# Patient Record
Sex: Male | Born: 1965 | Race: Asian | Hispanic: No | Marital: Married | Smoking: Former smoker
Health system: Southern US, Community
[De-identification: ages and names within clinical notes are randomized; demographics above are authoritative.]

## PROBLEM LIST (undated history)

## (undated) DIAGNOSIS — E785 Hyperlipidemia, unspecified: Secondary | ICD-10-CM

## (undated) DIAGNOSIS — A4901 Methicillin susceptible Staphylococcus aureus infection, unspecified site: Secondary | ICD-10-CM

## (undated) DIAGNOSIS — M4620 Osteomyelitis of vertebra, site unspecified: Secondary | ICD-10-CM

## (undated) DIAGNOSIS — M4640 Discitis, unspecified, site unspecified: Secondary | ICD-10-CM

## (undated) HISTORY — PX: NO PAST SURGERIES: SHX2092

## (undated) HISTORY — DX: Hyperlipidemia, unspecified: E78.5

## (undated) HISTORY — DX: Osteomyelitis of vertebra, site unspecified: M46.20

## (undated) HISTORY — DX: Methicillin susceptible Staphylococcus aureus infection, unspecified site: A49.01

## (undated) HISTORY — DX: Discitis, unspecified, site unspecified: M46.40

---

## 2009-06-15 ENCOUNTER — Ambulatory Visit: Payer: Self-pay | Admitting: Internal Medicine

## 2009-06-15 DIAGNOSIS — E785 Hyperlipidemia, unspecified: Secondary | ICD-10-CM | POA: Insufficient documentation

## 2009-06-17 ENCOUNTER — Encounter: Payer: Self-pay | Admitting: Internal Medicine

## 2009-06-17 LAB — CONVERTED CEMR LAB
Albumin: 4.3 g/dL (ref 3.5–5.2)
Alkaline Phosphatase: 57 units/L (ref 39–117)
Cholesterol: 229 mg/dL — ABNORMAL HIGH (ref 0–200)
Direct LDL: 152.1 mg/dL
HDL: 59.8 mg/dL (ref >39.00)
Total CHOL/HDL Ratio: 4
VLDL: 34 mg/dL (ref 0.0–40.0)

## 2010-05-15 ENCOUNTER — Ambulatory Visit: Payer: Self-pay | Admitting: Internal Medicine

## 2010-05-15 ENCOUNTER — Encounter: Payer: Self-pay | Admitting: Internal Medicine

## 2010-05-15 DIAGNOSIS — R1013 Epigastric pain: Secondary | ICD-10-CM

## 2010-05-15 LAB — CONVERTED CEMR LAB
BUN: 13 mg/dL (ref 6–23)
CO2: 28 meq/L (ref 19–32)
Chloride: 104 meq/L (ref 96–112)
Eosinophils Absolute: 0.2 10*3/uL (ref 0.0–0.7)
Glucose, Bld: 86 mg/dL (ref 70–99)
Lymphocytes Relative: 30 % (ref 12–46)
Lymphs Abs: 1.7 10*3/uL (ref 0.7–4.0)
MCV: 87.4 fL (ref 78.0–100.0)
Monocytes Relative: 8 % (ref 3–12)
Neutro Abs: 3.2 10*3/uL (ref 1.7–7.7)
Neutrophils Relative %: 57 % (ref 43–77)
Platelets: 312 10*3/uL (ref 150–400)
Potassium: 3.9 meq/L (ref 3.5–5.3)
RBC: 5.4 M/uL (ref 4.22–5.81)
Sodium: 139 meq/L (ref 135–145)
WBC: 5.6 10*3/uL (ref 4.0–10.5)

## 2010-05-19 ENCOUNTER — Telehealth: Payer: Self-pay | Admitting: Internal Medicine

## 2010-05-19 ENCOUNTER — Encounter: Payer: Self-pay | Admitting: Internal Medicine

## 2010-05-19 ENCOUNTER — Encounter (INDEPENDENT_AMBULATORY_CARE_PROVIDER_SITE_OTHER): Payer: Self-pay | Admitting: *Deleted

## 2010-05-19 DIAGNOSIS — A048 Other specified bacterial intestinal infections: Secondary | ICD-10-CM | POA: Insufficient documentation

## 2010-05-25 ENCOUNTER — Ambulatory Visit: Payer: Self-pay | Admitting: Internal Medicine

## 2010-05-30 ENCOUNTER — Encounter: Payer: Self-pay | Admitting: Gastroenterology

## 2010-06-27 NOTE — Assessment & Plan Note (Signed)
Summary: NEW / [employer] / #/ CD   Vital Signs:  Patient profile:   45 year old male Height:      64 inches Weight:      171 pounds BMI:     29.46 O2 Sat:      96 % on Room air Temp:     97.5 degrees F oral Pulse rate:   86 / minute Pulse rhythm:   regular Resp:     16 per minute BP sitting:   108 / 78  (left arm) Cuff size:   large  Vitals Entered By: Rock Nephew CMA (June 15, 2009 2:44 PM)  Nutrition Counseling: Patient's BMI is greater than 25 and therefore counseled on weight management options.  O2 Flow:  Room air CC: new to establish Is Patient Diabetic? No Pain Assessment Patient in pain? no        Primary Care Provider:  Etta Grandchild MD  CC:  new to establish.  History of Present Illness: New to me for a complete physical, no complaints.  Preventive Screening-Counseling & Management  Alcohol-Tobacco     Alcohol drinks/day: <1     Alcohol type: spirits     >5/day in last 3 mos: no     Alcohol Counseling: not indicated; use of alcohol is not excessive or problematic     Feels need to cut down: no     Feels annoyed by complaints: no     Feels guilty re: drinking: no     Needs 'eye opener' in am: no     Smoking Status: never  Caffeine-Diet-Exercise     Does Patient Exercise: yes  Hep-HIV-STD-Contraception     Hepatitis Risk: no risk noted     HIV Risk: no risk noted     STD Risk: no risk noted     TSE monthly: yes     Testicular SE Education/Counseling to perform regular STE      Sexual History:  currently monogamous.        Drug Use:  never and no.        Blood Transfusions:  no.    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 Married Never Smoked Alcohol use-yes Drug use-no Regular exercise-yes Smoking Status:  never Drug Use:  never, no Does Patient Exercise:  yes Hepatitis Risk:  no risk noted HIV Risk:  no risk noted STD Risk:  no risk noted Sexual History:  currently monogamous Blood Transfusions:  no  Review of Systems  The patient denies anorexia, fever, weight loss, weight gain, chest pain, dyspnea on exertion, prolonged cough, headaches, hemoptysis, abdominal pain, melena, hematochezia, severe indigestion/heartburn, hematuria, suspicious skin lesions, enlarged lymph nodes, angioedema, and testicular masses.    Physical Exam  General:  alert, well-developed, well-nourished, well-hydrated, appropriate dress, normal appearance, healthy-appearing, cooperative to examination, and good hygiene.   Head:  normocephalic, atraumatic, no abnormalities observed, and no abnormalities palpated.   Eyes:  vision grossly intact, pupils equal, pupils round, and pupils reactive to light.   Mouth:  Oral mucosa and oropharynx without lesions or exudates.  Teeth in good repair. Neck:  supple, full ROM, no masses, no thyromegaly, no  thyroid nodules or tenderness, no JVD, normal carotid upstroke, no carotid bruits, no cervical lymphadenopathy, and no neck tenderness.   Chest Wall:  No deformities, masses, tenderness or gynecomastia noted. Breasts:  No masses or gynecomastia noted Lungs:  Normal respiratory effort, chest expands symmetrically. Lungs are clear to auscultation, no crackles or wheezes. Heart:  Normal rate and regular rhythm. S1 and S2 normal without gallop, murmur, click, rub or other extra sounds. Abdomen:  soft, non-tender, normal bowel sounds, no distention, no masses, no guarding, no rigidity, no rebound tenderness, no abdominal hernia, no hepatomegaly, and no splenomegaly.   Rectal:  No external abnormalities noted. Normal  sphincter tone. No rectal masses or tenderness. heme neg.stool. Genitalia:  circumcised, no hydrocele, no varicocele, no scrotal masses, no testicular masses or atrophy, no cutaneous lesions, and no urethral discharge.   Prostate:  Prostate gland firm and smooth, no enlargement, nodularity, tenderness, mass, asymmetry or induration. Msk:  No deformity or scoliosis noted of thoracic or lumbar spine.   Pulses:  R and L carotid,radial,femoral,dorsalis pedis and posterior tibial pulses are full and equal bilaterally Extremities:  No clubbing, cyanosis, edema, or deformity noted with normal full range of motion of all joints.   Neurologic:  No cranial nerve deficits noted. Station and gait are normal. Plantar reflexes are down-going bilaterally. DTRs are symmetrical throughout. Sensory, motor and coordinative functions appear intact. Skin:  Intact without suspicious lesions or rashes Cervical Nodes:  No lymphadenopathy noted Axillary Nodes:  No palpable lymphadenopathy Inguinal Nodes:  No significant adenopathy Psych:  Cognition and judgment appear intact. Alert and cooperative with normal attention span and concentration. No apparent delusions, illusions, hallucinations Additional Exam:  EKG is normal.   Impression & Recommendations:  Problem # 1:  ROUTINE GENERAL MEDICAL EXAM@HEALTH  CARE FACL (ICD-V70.0) Assessment New  Orders: Hemoccult Guaiac-1 spec.(in office) (82270) EKG w/ Interpretation (93000)  Discussed using sunscreen, use of alcohol, drug use, self testicular exam, routine dental care, routine eye care, routine physical exam, seat belts, multiple vitamins,  and recommendations for immunizations.  Discussed exercise and checking cholesterol.    Problem # 2:  HYPERLIPIDEMIA (ICD-272.4) Assessment: New  Orders: Venipuncture (50539) TLB-Lipid Panel (80061-LIPID) TLB-Hepatic/Liver Function Pnl (80076-HEPATIC) TLB-TSH (Thyroid Stimulating Hormone) (76734-LPF)  Patient  Instructions: 1)  Please schedule a follow-up appointment as needed. 2)  It is important that you exercise regularly at least 20 minutes 5 times a week. If you develop chest pain, have severe difficulty breathing, or feel very tired , stop exercising immediately and seek medical attention. 3)  You need to lose weight. Consider a lower calorie diet and regular exercise.

## 2010-06-27 NOTE — Letter (Signed)
Summary: Results Follow-up Letter  [facility]  [address] [place], [place] [address]   Phone: [phone]  Fax: [phone]    06/17/2009  [address] [place], [place]  [address]  Dear Mr. JEFFERYS,   The following are the results of your recent test(s):  Test     Result     Liver       normal Thyroid     normal  _________________________________________________________  Please call for an appointment in 1-2 months _________________________________________________________ _________________________________________________________ _________________________________________________________  Sincerely,  Sanda Linger MD [facility]

## 2010-06-27 NOTE — Letter (Signed)
Summary: Lipid Letter  [facility]  [address] [place], [place] [address]   Phone: [phone]  Fax: [phone]    06/17/2009  Mitchell Sparks [address] [place], [place]  [address]  Dear Mitchell Sparks:  We have carefully reviewed your last lipid profile from  and the results are noted below with a summary of recommendations for lipid management.    Cholesterol:       229     Goal: <200   HDL "good" Cholesterol:   62.37     Goal: >40   LDL "bad" Cholesterol:   152     Goal: <130   Triglycerides:       170.0     Goal: <150    there's good news and bad news with these results    TLC Diet (Therapeutic Lifestyle Change): Saturated Fats & Transfatty acids should be kept < 7% of total calories ***Reduce Saturated Fats Polyunstaurated Fat can be up to 10% of total calories Monounsaturated Fat Fat can be up to 20% of total calories Total Fat should be no greater than 25-35% of total calories Carbohydrates should be 50-60% of total calories Protein should be approximately 15% of total calories Fiber should be at least 20-30 grams a day ***Increased fiber may help lower LDL Total Cholesterol should be < 200mg /day Consider adding plant stanol/sterols to diet (example: Benacol spread) ***A higher intake of unsaturated fat may reduce Triglycerides and Increase HDL    Adjunctive Measures (may lower LIPIDS and reduce risk of Heart Attack) include: Aerobic Exercise (20-30 minutes 3-4 times a week) Limit Alcohol Consumption Weight Reduction Aspirin 75-81 mg a day by mouth (if not allergic or contraindicated) Dietary Fiber 20-30 grams a day by mouth     Current Medications:  None If you have any questions, please call. We appreciate being able to work with you.   Sincerely,    [facility] Mitchell Grandchild MD

## 2010-06-28 ENCOUNTER — Encounter: Payer: Self-pay | Admitting: Internal Medicine

## 2010-06-29 NOTE — Progress Notes (Signed)
Summary: Lab results & referral to GI  Phone Note Outgoing Call   Summary of Call: call pt - blood test shows positive antibody for H. Pylori.  I suggest GI referral for further testing Initial call taken by: D. Thomos Lemons DO,  May 19, 2010 3:22 PM  Follow-up for Phone Call        call placed to patient at [phone], he has been advised per Dr Artist Pais instructions.  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

## 2010-06-29 NOTE — Assessment & Plan Note (Signed)
Summary: TO EST/HEA  ABD PAIN   Vital Signs:  Patient profile:   45 year old male Height:      65 inches Weight:      169.50 pounds BMI:     28.31 O2 Sat:      97 % on Room air Temp:     98.1 degrees F oral Pulse rate:   87 / minute Resp:     20 per minute BP sitting:   100 / 70  (left arm) Cuff size:   large  Vitals Entered By: Glendell Docker CMA (May 15, 2010 1:19 PM)  O2 Flow:  Room air CC: New Patient  Is Patient Diabetic? No Pain Assessment Patient in pain? no      Comments stomach problems, c/o bloating , no bowel problems for the past 2 months   Primary Care Provider:  Dondra Spry DO  CC:  New Patient .  History of Present Illness: 45 y/o Asian male to establish wakes up at night with burning sensation abd bloating - uncomfortable sensation onset - on and off 2 months,  worse over last 2 weeks food can improve symptoms localized to epigastric area no NSAIDs but has been taking some ibuprofen for several days for right forearm pain  previously tried reglan - "chinese version"  denies frequent heartburn no dysphagia  normal BMs no melena or hematochezia    Preventive Screening-Counseling & Management  Alcohol-Tobacco     Alcohol drinks/day: <1     Smoking Status: quit     Packs/Day: 0.25     Year Started: 1988     Year Quit: 1993  Caffeine-Diet-Exercise     Caffeine use/day: 3 beverages daily     Does Patient Exercise: yes     Times/week: <3  Allergies (verified): No Known Drug Allergies  Past History:  Past Medical History: Hyperlipidemia    Past Surgical History: Denies surgical history    Family History: Family History Diabetes 1st degree relative Family History High cholesterol Family History Hypertension Family History of Stroke F 1st degree relative <60 mother has "stomach upset" no GI cancers  Social History: Occupation: PhD Chemist in [employer]   moved to [place] 1996 - prev lived near [place] Married- 18  years Never Smoked Alcohol use-yes  Drug use-no Regular exercise-yes 1 son 3 1 daughter age 4Smoking Status:  quit Packs/Day:  0.25 Caffeine use/day:  3 beverages daily  Review of Systems  The patient denies anorexia, fever, weight loss, weight gain, chest pain, dyspnea on exertion, prolonged cough, melena, hematochezia, and severe indigestion/heartburn.    Physical Exam  General:  alert, well-developed, and well-nourished.   Mouth:  good dentition, no gingival abnormalities, and pharynx pink and moist.   Neck:  No deformities, masses, or tenderness noted. Lungs:  normal respiratory effort and normal breath sounds.   Heart:  normal rate, regular rhythm, no murmur, and no gallop.   Abdomen:  soft, non-tender, normal bowel sounds, no masses, no hepatomegaly, and no splenomegaly.   Rectal:  no external abnormalities, no hemorrhoids, normal sphincter tone, and no masses.  hemoccult negativea Prostate:  no nodules and no asymmetry.     Impression & Recommendations:  Problem # 1:  ABDOMINAL PAIN, EPIGASTRIC (ICD-789.06) 45 y/o with epigastric discomfort.   possible PUD / gastritis.   consider H. Pylori If positive, refer to GI for EGD to rule out malignancy  use zantac as directed dietary measures reviewed  Orders: T-Basic Metabolic Panel [phone]) T-CBC w/Diff [phone])  T- * Misc. Laboratory test [phone])  Complete Medication List: 1)  Ranitidine Hcl 150 Mg Tabs (Ranitidine hcl) .... One by mouth two times a day  Patient Instructions: 1)  Please schedule a follow-up appointment in 3 months. 2)  Lipid Panel prior to visit, ICD-9: 272.3 3)  High sensitivity CRP :  272.4 4)  Please return for lab work one (1) week before your next appointment.  Prescriptions: RANITIDINE HCL 150 MG TABS (RANITIDINE HCL) one by mouth two times a day  #60 x 3   Entered and Authorized by:   D. Thomos Lemons DO   Signed by:   D. Thomos Lemons DO on 05/15/2010   Method used:   Electronically to         [id number] (retail)       [address]       [place], [place]  [address]       Ph: [phone] or [phone]       Fax: [phone]   RxID:   [id number]    Orders Added: 1)  T-Basic Metabolic Panel [phone] 2)  T-CBC w/Diff [[id number]] 3)  T- * Misc. Laboratory test [99999] 4)  New Patient Level III [99203]   Immunization History:  Influenza Immunization History:    Influenza:  historical (05/09/2010)   Immunization History:  Influenza Immunization History:    Influenza:  Historical (05/09/2010)  Current Allergies (reviewed today): No known allergies

## 2010-06-29 NOTE — Letter (Signed)
Summary: Primary Care Consult Scheduled Letter  [facility] [place] [facility]  [address]   [place], [place] [address]   Phone: [phone]  Fax: [phone]      05/19/2010 MRN: [id number]  [facility] [place] [address] [place], [place]  [address]    Dear Mr. CLAPPER,      We have scheduled an appointment for you.  At the recommendation of Dr.YOO, we have scheduled you a consult with [facility] , DR Marina Goodell on JANUARY 29,2012  at 9:15AM .  Their address is_[address], [place]  . The office phone number is [phone].  If this appointment day and time is not convenient for you, please feel free to call the office of the doctor you are being referred to at the number listed above and reschedule the appointment.     It is important for you to keep your scheduled appointments. We are here to make sure you are given good patient care.     Thank you, Darral Dash Patient Care Coordinator [place] [facility]

## 2010-06-29 NOTE — Letter (Signed)
Summary: New Patient letter  [facility]  [address] [place], [place] [address]   Phone: [phone]  Fax: [phone]       05/30/2010 MRN: [id number]  [facility] [address] [place], [place]  [address]  Dear Mitchell Sparks,  Welcome to the Gastroenterology Division at [facility].    You are scheduled to see Dr.  Arlyce Dice on 07-05-10 at 3:30pm  on the 3rd floor at [facility], [address]  We ask that you try to arrive at our office 15 minutes prior to your appointment time to allow for check-in.  We would like you to complete the enclosed self-administered evaluation form prior to your visit and bring it with you on the day of your appointment.  We will review it with you.  Also, please bring a complete list of all your medications or, if you prefer, bring the medication bottles and we will list them.  Please bring your insurance card so that we may make a copy of it.  If your insurance requires a referral to see a specialist, please bring your referral form from your primary care physician.  Co-payments are due at the time of your visit and may be paid by cash, check or credit card.     Your office visit will consist of a consult with your physician (includes a physical exam), any laboratory testing he/she may order, scheduling of any necessary diagnostic testing (e.g. x-ray, ultrasound, CT-scan), and scheduling of a procedure (e.g. Endoscopy, Colonoscopy) if required.  Please allow enough time on your schedule to allow for any/all of these possibilities.    If you cannot keep your appointment, please call [phone] to cancel or reschedule prior to your appointment date.  This allows [place] the opportunity to schedule an appointment for another patient in need of care.  If you do not cancel or reschedule by 5 p.m. the business day prior to your appointment date, you will be charged a $50.00 late cancellation/no-show fee.    Thank you for choosing  [facility] for your medical needs.  We appreciate the opportunity to care for you.  Please visit [place] at our website  to learn more about our practice.                     Sincerely,                                                             The Gastroenterology Division

## 2010-06-29 NOTE — Letter (Signed)
Summary: New Patient letter  [facility]  [address] [place], [place] [address]   Phone: [phone]  Fax: [phone]       05/19/2010 MRN: [id number]  [facility] Mitchell Sparks [address] [place], [place]  [address]  Dear Mitchell Sparks,  Welcome to the Gastroenterology Division at [facility].    You are scheduled to see Dr.  Marina Goodell on 05-25-10 at 9:15am on the 3rd floor at [facility], [address].  We ask that you try to arrive at our office 15 minutes prior to your appointment time to allow for check-in.  We would like you to complete the enclosed self-administered evaluation form prior to your visit and bring it with you on the day of your appointment.  We will review it with you.  Also, please bring a complete list of all your medications or, if you prefer, bring the medication bottles and we will list them.  Please bring your insurance card so that we may make a copy of it.  If your insurance requires a referral to see a specialist, please bring your referral form from your primary care physician.  Co-payments are due at the time of your visit and may be paid by cash, check or credit card.     Your office visit will consist of a consult with your physician (includes a physical exam), any laboratory testing he/she may order, scheduling of any necessary diagnostic testing (e.g. x-ray, ultrasound, CT-scan), and scheduling of a procedure (e.g. Endoscopy, Colonoscopy) if required.  Please allow enough time on your schedule to allow for any/all of these possibilities.    If you cannot keep your appointment, please call [phone] to cancel or reschedule prior to your appointment date.  This allows [place] the opportunity to schedule an appointment for another patient in need of care.  If you do not cancel or reschedule by 5 p.m. the business day prior to your appointment date, you will be charged a $50.00 late cancellation/no-show fee.    Thank you for choosing  [facility] for your medical needs.  We appreciate the opportunity to care for you.  Please visit [place] at our website  to learn more about our practice.                     Sincerely,                                                             The Gastroenterology Division

## 2010-07-05 ENCOUNTER — Ambulatory Visit (INDEPENDENT_AMBULATORY_CARE_PROVIDER_SITE_OTHER): Payer: BC Managed Care – PPO | Admitting: Gastroenterology

## 2010-07-05 ENCOUNTER — Encounter: Payer: Self-pay | Admitting: Gastroenterology

## 2010-07-05 ENCOUNTER — Other Ambulatory Visit: Payer: BC Managed Care – PPO

## 2010-07-05 DIAGNOSIS — R1013 Epigastric pain: Secondary | ICD-10-CM

## 2010-07-05 DIAGNOSIS — A048 Other specified bacterial intestinal infections: Secondary | ICD-10-CM

## 2010-07-07 ENCOUNTER — Encounter: Payer: Self-pay | Admitting: Gastroenterology

## 2010-07-11 ENCOUNTER — Encounter: Payer: Self-pay | Admitting: Gastroenterology

## 2010-07-13 NOTE — Letter (Signed)
Summary: Results Letter  [facility]  [address] [place], [place] [address]   Phone: [phone]  Fax: [phone]        July 05, 2010 MRN: [id number]    [facility] [address] [place], [place]  [address]    Dear Mr. AUBUCHON,  It is my pleasure to have treated you recently as a new patient in my office. I appreciate your confidence and the opportunity to participate in your care.  Since I do have a busy inpatient endoscopy schedule and office schedule, my office hours vary weekly. I am, however, available for emergency calls everyday through my office. If I am not available for an urgent office appointment, another one of our gastroenterologist will be able to assist you.  My well-trained staff are prepared to help you at all times. For emergencies after office hours, a physician from our Gastroenterology section is always available through my 24 hour answering service  Once again I welcome you as a new patient and I look forward to a happy and healthy relationship             Sincerely,  Louis Meckel MD  This letter has been electronically signed by your physician.  Appended Document: Results Letter LETTER MAILED

## 2010-07-13 NOTE — Assessment & Plan Note (Signed)
Summary: H PILORY/YF NO GI HX PER HELEN [phone]/INS BLU CROSS/MAILER ...   History of Present Illness Visit Type: Initial Consult Primary GI MD: Mitchell Heaps MD [facility] Primary Provider: Dondra Spry DO Requesting Provider: Dondra Spry DO Chief Complaint: Patient dx with h pylori and was given Ranitidine no antibiotics. He states that he never started the Ranitidine and he is feeling better than he did at his last office visit with Mitchell Sparks. History of Present Illness:   Mitchell Sparks is a 45 year old Asian male referred at the request of Mitchell Sparks for evaluation of abdominal pain.  Approximately a month ago he was seen by Mitchell Sparks for upper abdominal discomfort.  This was described as an aching pain.  Blood work was positive for H. pylori antibody.  Although prescribed a PPI he did not take any medicines.  Symptoms subsequently subsided.  He now has only rare abdominal bloating.   GI Review of Systems      Denies abdominal pain, acid reflux, belching, bloating, chest pain, dysphagia with liquids, dysphagia with solids, heartburn, loss of appetite, nausea, vomiting, vomiting blood, weight loss, and  weight gain.        Denies anal fissure, black tarry stools, change in bowel habit, constipation, diarrhea, diverticulosis, fecal incontinence, heme positive stool, hemorrhoids, irritable bowel syndrome, jaundice, light color stool, liver problems, rectal bleeding, and  rectal pain.    Current Medications (verified): 1)  None  Allergies (verified): No Known Drug Allergies  Past History:  Past Medical History: Hyperlipidemia h pylori  Past Surgical History: Reviewed history from 05/15/2010 and no changes required. Denies surgical history    Family History: Family History Diabetes 1st degree relative: father mother has "stomach upset" no GI cancers  Social History: Occupation: PhD Chemist in [employer]   moved to [place] 1996 - prev lived near [place] Married- 18 years Never Smoked Alcohol  use-yes  socially Drug use-no Regular exercise-yes 1 son 7 1 daughter age 72 Daily Caffeine Use 2 cups per day  Review of Systems  The patient denies allergy/sinus, anemia, anxiety-new, arthritis/joint pain, back pain, blood in urine, breast changes/lumps, change in vision, confusion, cough, coughing up blood, depression-new, fainting, fatigue, fever, headaches-new, hearing problems, heart murmur, heart rhythm changes, itching, muscle pains/cramps, night sweats, nosebleeds, shortness of breath, skin rash, sleeping problems, sore throat, swelling of feet/legs, swollen lymph glands, thirst - excessive, urination - excessive, urination changes/pain, urine leakage, vision changes, and voice change.    Vital Signs:  Patient profile:   45 year old male Height:      65 inches Weight:      169.8 pounds BMI:     28.36 Pulse rate:   88 / minute Pulse rhythm:   regular BP sitting:   110 / 70  (left arm) Cuff size:   regular  Vitals Entered By: Mitchell Sparks CMA Duncan Dull) (July 05, 2010 3:46 PM)  Physical Exam  Additional Exam:  On physical exam he is a healthy-appearing male  skin: anicteric HEENT: normocephalic; PEERLA; no nasal or pharyngeal abnormalities neck: supple nodes: no cervical lymphadenopathy chest: clear to ausculatation and percussion heart: no murmurs, gallops, or rubs abd: soft, nontender; BS normoactive; no abdominal masses, tenderness, organomegaly rectal: deferred ext: no cynanosis, clubbing, edema skeletal: no deformities neuro: oriented x 3; no focal abnormalities    Impression & Recommendations:  Problem # 1:  HELICOBACTER PYLORI INFECTION (ICD-041.86) serum antibody positivity reflex exposure to H. pylori.  It is not specific for a  current infection.  Recommendations #1 check stools for  H.pylori antigen; if positive I will treat him.  If negative, in the absence of symptoms, I would not pursue this any further. Orders: T-Helicobactor Pylori Antigen Stool  ([address])  Patient Instructions: 1)  Copy sent to : D. Thomos Lemons DO 2)  You will go to the basement today for labs 3)  The medication list was reviewed and reconciled.  All changed / newly prescribed medications were explained.  A complete medication list was provided to the patient / caregiver.

## 2010-07-19 NOTE — Letter (Signed)
Summary: Results Letter  [facility]  [address] [place], [place] [address]   Phone: [phone]  Fax: [phone]        July 11, 2010 MRN: [id number]    [facility] [address] [place], [place]  [address]    Dear Mr. NOVELLO,  Your biopsies revealed  the presence of a bacteria called H. Pylori.  This is associated with recurrent inflammation of the stomach and duodenum, and recurrent ulcer disease.  My nurse will be calling in a prescription for treatment.            Sincerely,  Louis Meckel MD  This letter has been electronically signed by your physician.  Appended Document: Results Letter Letter is mailed to the patient's home address

## 2010-08-18 ENCOUNTER — Encounter: Payer: Self-pay | Admitting: Internal Medicine

## 2010-08-18 ENCOUNTER — Ambulatory Visit: Payer: Self-pay | Admitting: Internal Medicine

## 2010-08-18 ENCOUNTER — Ambulatory Visit (INDEPENDENT_AMBULATORY_CARE_PROVIDER_SITE_OTHER): Payer: BC Managed Care – PPO | Admitting: Internal Medicine

## 2010-08-18 DIAGNOSIS — A048 Other specified bacterial intestinal infections: Secondary | ICD-10-CM

## 2010-08-18 DIAGNOSIS — N529 Male erectile dysfunction, unspecified: Secondary | ICD-10-CM

## 2010-08-18 DIAGNOSIS — E785 Hyperlipidemia, unspecified: Secondary | ICD-10-CM

## 2010-08-18 NOTE — Progress Notes (Signed)
  Subjective:    Patient ID: Mitchell Sparks, male    DOB: 05/03/1966, 45 y.o.   MRN: [id number]  HPI 45 y/o male for followup Pt prev seen for dyspepsis.  Pt found to have H Pylori infection.  (stool antigen was positive) Pt completely full course of abx and PPI.  He is feeling much better. He denies dysphagia.   Review of Systems He c/o poor erection quality.  Mild decrease in libido    Objective:   Physical Exam  Constitutional: He appears well-developed and well-nourished.  Cardiovascular: Normal rate, regular rhythm and normal heart sounds.   Pulmonary/Chest: Effort normal and breath sounds normal.  Abdominal: Soft. Bowel sounds are normal. He exhibits no mass. There is no tenderness.          Assessment & Plan:

## 2010-08-24 NOTE — Assessment & Plan Note (Signed)
Rule out hypogonadism

## 2010-08-24 NOTE — Assessment & Plan Note (Signed)
Reassess risk.  Lab Results  Component Value Date   CHOL 229* 06/17/2009   Lab Results  Component Value Date   HDL 59.80 06/17/2009   No results found for this basename: [facility]   Lab Results  Component Value Date   TRIG 170.0* 06/17/2009   Lab Results  Component Value Date   CHOLHDL 4 06/17/2009

## 2010-08-24 NOTE — Assessment & Plan Note (Signed)
Improved with prev pac. Repeat H. Pylori stool antigen to ensure eradication

## 2010-09-14 LAB — HIGH SENSITIVITY CRP: CRP, High Sensitivity: 0.3 mg/L

## 2010-09-14 LAB — LIPID PANEL
LDL Cholesterol: 143 mg/dL — ABNORMAL HIGH (ref 0–99)
Triglycerides: 210 mg/dL — ABNORMAL HIGH (ref <150)
VLDL: 42 mg/dL — ABNORMAL HIGH (ref 0–40)

## 2010-09-16 LAB — HELICOBACTER PYLORI  SPECIAL ANTIGEN

## 2010-09-21 ENCOUNTER — Ambulatory Visit (INDEPENDENT_AMBULATORY_CARE_PROVIDER_SITE_OTHER): Payer: BC Managed Care – PPO | Admitting: Internal Medicine

## 2010-09-21 ENCOUNTER — Encounter: Payer: Self-pay | Admitting: Internal Medicine

## 2010-09-21 VITALS — BP 100/70 | HR 88 | Temp 97.8°F | Resp 18 | Ht 65.0 in | Wt 171.0 lb

## 2010-09-21 DIAGNOSIS — E785 Hyperlipidemia, unspecified: Secondary | ICD-10-CM

## 2010-09-21 DIAGNOSIS — N529 Male erectile dysfunction, unspecified: Secondary | ICD-10-CM

## 2010-09-21 DIAGNOSIS — D2360 Other benign neoplasm of skin of unspecified upper limb, including shoulder: Secondary | ICD-10-CM

## 2010-09-21 DIAGNOSIS — A048 Other specified bacterial intestinal infections: Secondary | ICD-10-CM

## 2010-09-21 NOTE — Patient Instructions (Signed)
Please complete the following lab tests before your next follow up appointment: FLP, CRP  - 272.4 

## 2010-09-26 LAB — TESTOSTERONE: Testosterone: 296.59 ng/dL (ref 250–890)

## 2010-10-02 NOTE — Progress Notes (Deleted)
Pt can follow up within 1 year

## 2010-10-06 ENCOUNTER — Telehealth: Payer: Self-pay | Admitting: Internal Medicine

## 2010-10-06 NOTE — Telephone Encounter (Signed)
Patient states he spoke with the nurse a week ago about his testosterone level and when to follow up on the results the nurse gave him on the phone.  States he asked her at that time what to do and has been waiting for an answer

## 2010-10-08 NOTE — Telephone Encounter (Signed)
If he would like to try taking viagra for mild ED, he can pick up sample at office

## 2010-10-08 NOTE — Telephone Encounter (Signed)
Please notify pt that his testosterone levels are normal and he can follow up within  1 yr

## 2010-10-09 MED ORDER — SILDENAFIL CITRATE 50 MG PO TABS: 50.0000 mg | ORAL_TABLET | ORAL | Status: DC | PRN

## 2010-10-09 NOTE — Telephone Encounter (Signed)
Left message on machine for pt to return my call  

## 2010-10-09 NOTE — Telephone Encounter (Signed)
Pt notified and is agreeable to pick up sample. 1 pack left at front desk.

## 2010-10-16 DIAGNOSIS — D2360 Other benign neoplasm of skin of unspecified upper limb, including shoulder: Secondary | ICD-10-CM | POA: Insufficient documentation

## 2010-10-16 NOTE — Assessment & Plan Note (Addendum)
  Pt would like to try lifestyle changes.  Low sat fat diet handout provided. I encouraged regular exercise  Lab Results  Component Value Date   CHOL 243* 09/14/2010   CHOL 229* 06/17/2009   Lab Results  Component Value Date   HDL 58 09/14/2010   HDL 59.80 06/17/2009   Lab Results  Component Value Date   LDLCALC 143* 09/14/2010   Lab Results  Component Value Date   TRIG 210* 09/14/2010   TRIG 170.0* 06/17/2009   Lab Results  Component Value Date   CHOLHDL 4.2 09/14/2010   CHOLHDL 4 06/17/2009   Lab Results  Component Value Date   LDLDIRECT 152.1 06/17/2009

## 2010-10-16 NOTE — Assessment & Plan Note (Signed)
Pt with enlarging hyperpigmented lesion of right upper arm Consent obtained Sterile technique utilized to perform shave biopsy with derma blade and forceps. 0.2 cc of lidocaine with epi used for anesthesia. Aftercare discussed. Specimen sent to pathology.

## 2010-10-16 NOTE — Progress Notes (Signed)
  Subjective:    Patient ID: Mitchell Sparks, male    DOB: March 13, 1966, 45 y.o.   MRN: [id number]  Hyperlipidemia This is a recurrent problem. The current episode started more than 1 month ago. The problem is uncontrolled. Recent lipid tests were reviewed and are high. He has no history of diabetes, hypothyroidism or liver disease. Factors aggravating his hyperlipidemia include no known factors. Pertinent negatives include no chest pain. He is currently on no antihyperlipidemic treatment. Risk factors for coronary artery disease include a sedentary lifestyle.   Pt also complains of enlarging right upper arm raised hyperpigmented skin lesion.  Review of Systems  Cardiovascular: Negative for chest pain.    Past Medical History  Diagnosis Date  . Hyperlipemia     History   Social History  . Marital Status: Married    Spouse Name: N/A    Number of Children: N/A  . Years of Education: N/A   Occupational History  . Not on file.   Social History Main Topics  . Smoking status: Former Games developer  . Smokeless tobacco: Not on file  . Alcohol Use: Yes  . Drug Use: No  . Sexually Active: Not on file   Other Topics Concern  . Not on file   Social History Narrative  . No narrative on file    Past Surgical History  Procedure Date  . Denies     Family History  Problem Relation Age of Onset  . Diabetes    . Hyperlipidemia    . Hypertension    . Stroke      No Known Allergies  No current outpatient prescriptions on file prior to visit.    BP 100/70  Pulse 88  Temp(Src) 97.8 F (36.6 C) (Oral)  Resp 18  Ht 5\' 5"  (1.651 m)  Wt 171 lb (77.565 kg)  BMI 28.46 kg/m2  SpO2 98%       Objective:   Physical Exam  Constitutional: He appears well-developed and well-nourished.  Cardiovascular: Normal rate, regular rhythm and normal heart sounds.   Pulmonary/Chest: Effort normal and breath sounds normal.  Abdominal: Soft. Bowel sounds are normal.  Skin:       3- 4 mm raised  hyperpigmented skin lesion right upper arm  Psychiatric: He has a normal mood and affect. His behavior is normal.       Assessment & Plan:

## 2010-10-16 NOTE — Assessment & Plan Note (Signed)
H.Pylori stool antigen: negative

## 2011-11-26 ENCOUNTER — Encounter: Payer: Self-pay | Admitting: Internal Medicine

## 2011-11-26 ENCOUNTER — Other Ambulatory Visit: Payer: Self-pay | Admitting: *Deleted

## 2011-11-26 ENCOUNTER — Ambulatory Visit (INDEPENDENT_AMBULATORY_CARE_PROVIDER_SITE_OTHER): Payer: BC Managed Care – PPO | Admitting: Internal Medicine

## 2011-11-26 VITALS — BP 102/68 | HR 84 | Temp 98.1°F | Resp 16 | Ht 65.0 in | Wt 169.8 lb

## 2011-11-26 DIAGNOSIS — Z0000 Encounter for general adult medical examination without abnormal findings: Secondary | ICD-10-CM

## 2011-11-26 DIAGNOSIS — N529 Male erectile dysfunction, unspecified: Secondary | ICD-10-CM

## 2011-11-26 DIAGNOSIS — G4700 Insomnia, unspecified: Secondary | ICD-10-CM

## 2011-11-26 MED ORDER — SILDENAFIL CITRATE 50 MG PO TABS: 50.0000 mg | ORAL_TABLET | ORAL | Status: DC | PRN

## 2011-11-26 NOTE — Assessment & Plan Note (Signed)
Attempt viagra prn. Dosing instructions provided.

## 2011-11-26 NOTE — Progress Notes (Signed)
  Subjective:    Patient ID: Mitchell Sparks, male    DOB: [birth year], 46 y.o.   MRN: [id number]  HPI Pt presents to clinic for annual physical. Notes h/o ED previously prescribed viagra but did not receive prescription. H/o mild hyperlipidemia not requiring medication to date. Notes increased job stress this year and subsequent insomnia. Initiates sleep but intermittently awakens at 2-3 am and finds it difficult to return to sleep.   Past Medical History  Diagnosis Date  . Hyperlipemia    Past Surgical History  Procedure Date  . Denies     reports that he has quit smoking. He does not have any smokeless tobacco history on file. He reports that he drinks alcohol. He reports that he does not use illicit drugs. family history includes Diabetes in an unspecified family member; Hyperlipidemia in an unspecified family member; Hypertension in an unspecified family member; and Stroke in an unspecified family member. No Known Allergies   Review of Systems see hpi     Objective:   Physical Exam  Nursing note and vitals reviewed. Constitutional: He appears well-developed and well-nourished. No distress.  HENT:  Head: Normocephalic and atraumatic.  Right Ear: Tympanic membrane, external ear and ear canal normal.  Left Ear: Tympanic membrane, external ear and ear canal normal.  Nose: Nose normal.  Mouth/Throat: Oropharynx is clear and moist. No oropharyngeal exudate.  Eyes: Conjunctivae and EOM are normal. Pupils are equal, round, and reactive to light. No scleral icterus.  Neck: Neck supple. Carotid bruit is not present. No thyromegaly present.  Cardiovascular: Normal rate, regular rhythm and normal heart sounds.  Exam reveals no gallop and no friction rub.   No murmur heard. Pulmonary/Chest: Effort normal and breath sounds normal. No respiratory distress. He has no wheezes. He has no rales.  Abdominal: Soft. Normal appearance and bowel sounds are normal. He exhibits no distension and no mass.  There is no hepatosplenomegaly. There is no tenderness. There is no rebound and no guarding.  Lymphadenopathy:    He has no cervical adenopathy.  Neurological: He is alert.  Skin: Skin is warm and dry. He is not diaphoretic.  Psychiatric: He has a normal mood and affect.          Assessment & Plan:

## 2011-11-26 NOTE — Patient Instructions (Addendum)
-  Please schedule fasting labs for tomorrow morning Cbc, chem7, lipid, lft, tsh, ua with reflex micro-v70.0 -Recommend trying any of the following for insomnia 1) diphenhydramine (benadryl) 25mg  before bed 2) valerian 3) melatonin -Please schedule same fasting labs for next year's physical

## 2011-11-26 NOTE — Assessment & Plan Note (Signed)
Nl exam. EKG obtained demonstrates NSR 78 with nl intervals and axis. Obtain cpe labs. Defers psa.

## 2011-11-26 NOTE — Assessment & Plan Note (Signed)
Attempt otc benadryl, valerian or melatonin otc. Followup if no improvement or worsening.

## 2011-11-27 LAB — CBC WITH DIFFERENTIAL/PLATELET
Basophils Absolute: 0 10*3/uL (ref 0.0–0.1)
Eosinophils Absolute: 0.4 10*3/uL (ref 0.0–0.7)
Eosinophils Relative: 8 % — ABNORMAL HIGH (ref 0–5)
MCH: 28.9 pg (ref 26.0–34.0)
MCV: 83.3 fL (ref 78.0–100.0)
Platelets: 313 10*3/uL (ref 150–400)
RDW: 13.6 % (ref 11.5–15.5)
WBC: 4.7 10*3/uL (ref 4.0–10.5)

## 2011-11-27 NOTE — Progress Notes (Signed)
Pt presented to the lab, future order released and given to the lab. 

## 2011-11-27 NOTE — Addendum Note (Signed)
Addended by: Mervin Kung A on: 11/27/2011 01:31 PM   Modules accepted: Orders

## 2011-11-28 LAB — BASIC METABOLIC PANEL
BUN: 13 mg/dL (ref 6–23)
Chloride: 103 mEq/L (ref 96–112)
Glucose, Bld: 93 mg/dL (ref 70–99)
Potassium: 4.3 mEq/L (ref 3.5–5.3)

## 2011-11-28 LAB — HEPATIC FUNCTION PANEL
AST: 20 U/L (ref 0–37)
Alkaline Phosphatase: 43 U/L (ref 39–117)
Bilirubin, Direct: 0.2 mg/dL (ref 0.0–0.3)
Indirect Bilirubin: 0.7 mg/dL (ref 0.0–0.9)
Total Bilirubin: 0.9 mg/dL (ref 0.3–1.2)

## 2011-11-28 LAB — URINALYSIS, ROUTINE W REFLEX MICROSCOPIC
Bilirubin Urine: NEGATIVE
Ketones, ur: NEGATIVE mg/dL
Nitrite: NEGATIVE
Specific Gravity, Urine: 1.011 (ref 1.005–1.030)
pH: 7 (ref 5.0–8.0)

## 2011-11-30 ENCOUNTER — Telehealth: Payer: Self-pay | Admitting: *Deleted

## 2011-11-30 DIAGNOSIS — E785 Hyperlipidemia, unspecified: Secondary | ICD-10-CM

## 2011-11-30 NOTE — Telephone Encounter (Signed)
LMOM with contact name & number for patient call back regarding lab results & MD instructions/SLS Future lab for lipid panel ordered in EMR.

## 2011-11-30 NOTE — Telephone Encounter (Signed)
Message copied by Regis Bill on Fri Nov 30, 2011  4:50 PM ------      Message from: Edwyna Perfect      Created: Wed Nov 28, 2011 11:41 PM       Chol is mildly high again. Wouldn't recommend medication at this time. Focus on low fat diet and regular exercise. Repeat lipid 5 months 272.4

## 2011-11-30 NOTE — Telephone Encounter (Signed)
Patient informed/SLS  

## 2012-02-22 ENCOUNTER — Encounter: Payer: Self-pay | Admitting: Internal Medicine

## 2012-02-22 ENCOUNTER — Ambulatory Visit (INDEPENDENT_AMBULATORY_CARE_PROVIDER_SITE_OTHER): Payer: BC Managed Care – PPO | Admitting: Internal Medicine

## 2012-02-22 VITALS — HR 83 | Temp 98.0°F | Resp 16 | Wt 162.5 lb

## 2012-02-22 DIAGNOSIS — J329 Chronic sinusitis, unspecified: Secondary | ICD-10-CM | POA: Insufficient documentation

## 2012-02-22 MED ORDER — AMOXICILLIN-POT CLAVULANATE 875-125 MG PO TABS: 1.0000 | ORAL_TABLET | Freq: Two times a day (BID) | ORAL | Status: DC

## 2012-02-22 MED ORDER — FLUTICASONE PROPIONATE 50 MCG/ACT NA SUSP: 2.0000 | Freq: Every day | NASAL | Status: DC

## 2012-02-22 NOTE — Progress Notes (Signed)
  Subjective:    Patient ID: Mitchell Sparks, male    DOB: 1966/02/26, 46 y.o.   MRN: [id number]  HPI patient presents to clinic for evaluation possible sinusitis. Notes over five day history of left maxillary pain with associated tooth pain. Has sore throat without fever chills or cough. No sick exposures. Taking over-the-counter medication for the problem. No alleviating or exacerbating factors. No other complaints.  Past Medical History  Diagnosis Date  . Hyperlipemia    Past Surgical History  Procedure Date  . Denies     reports that he has quit smoking. He does not have any smokeless tobacco history on file. He reports that he drinks alcohol. He reports that he does not use illicit drugs. family history includes Diabetes in an unspecified family member; Hyperlipidemia in an unspecified family member; Hypertension in an unspecified family member; and Stroke in an unspecified family member. No Known Allergies   Review of Systems see hpi       Objective:   Physical Exam  Nursing note and vitals reviewed. Constitutional: He appears well-developed and well-nourished. No distress.  HENT:  Head: Normocephalic and atraumatic.  Right Ear: External ear normal.  Left Ear: External ear normal.  Nose: Nose normal. Right sinus exhibits no maxillary sinus tenderness and no frontal sinus tenderness. Left sinus exhibits no maxillary sinus tenderness and no frontal sinus tenderness.  Mouth/Throat: Oropharynx is clear and moist. No oropharyngeal exudate.  Eyes: Conjunctivae normal and EOM are normal.  Neck: Neck supple.  Lymphadenopathy:    He has no cervical adenopathy.  Skin: He is not diaphoretic.          Assessment & Plan:

## 2012-02-22 NOTE — Assessment & Plan Note (Signed)
Attempt combination of Augmentin and Flonase. Followup if no improvement or worsening.

## 2012-03-12 ENCOUNTER — Encounter: Payer: Self-pay | Admitting: Internal Medicine

## 2012-03-12 ENCOUNTER — Ambulatory Visit (INDEPENDENT_AMBULATORY_CARE_PROVIDER_SITE_OTHER): Payer: BC Managed Care – PPO | Admitting: Internal Medicine

## 2012-03-12 VITALS — BP 112/76 | HR 82 | Temp 98.0°F | Resp 16 | Wt 160.8 lb

## 2012-03-12 DIAGNOSIS — J329 Chronic sinusitis, unspecified: Secondary | ICD-10-CM

## 2012-03-12 MED ORDER — METHYLPREDNISOLONE 4 MG PO KIT: PACK | ORAL | Status: DC

## 2012-03-12 MED ORDER — AMOXICILLIN-POT CLAVULANATE 875-125 MG PO TABS: 1.0000 | ORAL_TABLET | Freq: Two times a day (BID) | ORAL | Status: AC

## 2012-03-12 NOTE — Progress Notes (Signed)
  Subjective:    Patient ID: Mitchell Sparks, male    DOB: 1965/10/10, 46 y.o.   MRN: [id number]  HPI patient presents to clinic for followup of sinusitis. Recently treated with course of Augmentin and Flonase with improvement of symptoms. However after stopping the antibiotic symptoms returned. They're not as severe. Primarily involves paranasal pain and pressure and now left submandibular gland pain. No fever or chills. No alleviating or exacerbating factors.  Past Medical History  Diagnosis Date  . Hyperlipemia    Past Surgical History  Procedure Date  . Denies     reports that he has quit smoking. He does not have any smokeless tobacco history on file. He reports that he drinks alcohol. He reports that he does not use illicit drugs. family history includes Diabetes in an unspecified family member; Hyperlipidemia in an unspecified family member; Hypertension in an unspecified family member; and Stroke in an unspecified family member. No Known Allergies   Review of Systems see history of present illness     Objective:   Physical Exam  Nursing note and vitals reviewed. Constitutional: He appears well-developed and well-nourished. No distress.  HENT:  Head: Normocephalic and atraumatic.  Right Ear: External ear normal.  Left Ear: External ear normal.  Nose: Nose normal.  Mouth/Throat: Oropharynx is clear and moist. No oropharyngeal exudate.       Mild left paranasal sinus tenderness  Eyes: Conjunctivae normal and EOM are normal. No scleral icterus.  Neck: Neck supple.  Lymphadenopathy:    He has no cervical adenopathy.  Neurological: He is alert.  Skin: He is not diaphoretic.  Psychiatric: He has a normal mood and affect.          Assessment & Plan:

## 2012-03-12 NOTE — Assessment & Plan Note (Signed)
Improved but not resolved. Repeat Augmentin ten-day course. Attempt Medrol Dosepak. Followup if no improvement or worsening.

## 2012-05-01 ENCOUNTER — Telehealth: Payer: Self-pay | Admitting: Internal Medicine

## 2012-05-01 ENCOUNTER — Other Ambulatory Visit: Payer: Self-pay | Admitting: Internal Medicine

## 2012-05-01 DIAGNOSIS — J329 Chronic sinusitis, unspecified: Secondary | ICD-10-CM

## 2012-05-01 NOTE — Telephone Encounter (Signed)
Patient states that he has ongoing sinus issues and would like to be referred to an ENT.

## 2012-05-01 NOTE — Telephone Encounter (Signed)
Referral order placed.

## 2012-05-01 NOTE — Telephone Encounter (Signed)
Patient informed/SLS  

## 2012-05-02 ENCOUNTER — Ambulatory Visit: Payer: BC Managed Care – PPO | Admitting: Internal Medicine

## 2013-06-25 ENCOUNTER — Other Ambulatory Visit: Payer: Self-pay | Admitting: Family Medicine

## 2013-06-25 DIAGNOSIS — R109 Unspecified abdominal pain: Secondary | ICD-10-CM

## 2013-06-28 ENCOUNTER — Emergency Department (HOSPITAL_COMMUNITY)
Admission: EM | Admit: 2013-06-28 | Discharge: 2013-06-28 | Disposition: A | Discharge status: Home / Self Care | Payer: BC Managed Care – PPO | Attending: Emergency Medicine | Admitting: Emergency Medicine

## 2013-06-28 ENCOUNTER — Emergency Department (HOSPITAL_COMMUNITY): Payer: BC Managed Care – PPO

## 2013-06-28 ENCOUNTER — Encounter (HOSPITAL_COMMUNITY): Payer: Self-pay | Admitting: Emergency Medicine

## 2013-06-28 DIAGNOSIS — R1032 Left lower quadrant pain: Secondary | ICD-10-CM | POA: Insufficient documentation

## 2013-06-28 DIAGNOSIS — Z8639 Personal history of other endocrine, nutritional and metabolic disease: Secondary | ICD-10-CM | POA: Insufficient documentation

## 2013-06-28 DIAGNOSIS — R142 Eructation: Secondary | ICD-10-CM | POA: Insufficient documentation

## 2013-06-28 DIAGNOSIS — M545 Low back pain, unspecified: Secondary | ICD-10-CM | POA: Insufficient documentation

## 2013-06-28 DIAGNOSIS — R059 Cough, unspecified: Secondary | ICD-10-CM | POA: Insufficient documentation

## 2013-06-28 DIAGNOSIS — R509 Fever, unspecified: Secondary | ICD-10-CM | POA: Insufficient documentation

## 2013-06-28 DIAGNOSIS — N200 Calculus of kidney: Secondary | ICD-10-CM | POA: Insufficient documentation

## 2013-06-28 DIAGNOSIS — Z792 Long term (current) use of antibiotics: Secondary | ICD-10-CM | POA: Insufficient documentation

## 2013-06-28 DIAGNOSIS — Z862 Personal history of diseases of the blood and blood-forming organs and certain disorders involving the immune mechanism: Secondary | ICD-10-CM | POA: Insufficient documentation

## 2013-06-28 DIAGNOSIS — R05 Cough: Secondary | ICD-10-CM | POA: Insufficient documentation

## 2013-06-28 DIAGNOSIS — R141 Gas pain: Secondary | ICD-10-CM | POA: Insufficient documentation

## 2013-06-28 DIAGNOSIS — Z87891 Personal history of nicotine dependence: Secondary | ICD-10-CM | POA: Insufficient documentation

## 2013-06-28 DIAGNOSIS — M549 Dorsalgia, unspecified: Secondary | ICD-10-CM

## 2013-06-28 DIAGNOSIS — R143 Flatulence: Secondary | ICD-10-CM

## 2013-06-28 LAB — COMPREHENSIVE METABOLIC PANEL
ALT: 30 U/L (ref 0–53)
AST: 17 U/L (ref 0–37)
Albumin: 3.5 g/dL (ref 3.5–5.2)
Alkaline Phosphatase: 100 U/L (ref 39–117)
BILIRUBIN TOTAL: 0.5 mg/dL (ref 0.3–1.2)
BUN: 15 mg/dL (ref 6–23)
CALCIUM: 9.3 mg/dL (ref 8.4–10.5)
CHLORIDE: 100 meq/L (ref 96–112)
CO2: 27 meq/L (ref 19–32)
Creatinine, Ser: 1.02 mg/dL (ref 0.50–1.35)
GFR calc Af Amer: >90 mL/min (ref >90)
GFR, EST NON AFRICAN AMERICAN: 85 mL/min — AB (ref >90)
Glucose, Bld: 106 mg/dL — ABNORMAL HIGH (ref 70–99)
Potassium: 4.4 mEq/L (ref 3.7–5.3)
Sodium: 139 mEq/L (ref 137–147)
Total Protein: 8.2 g/dL (ref 6.0–8.3)

## 2013-06-28 LAB — CBC WITH DIFFERENTIAL/PLATELET
Basophils Absolute: 0 10*3/uL (ref 0.0–0.1)
Basophils Relative: 0 % (ref 0–1)
EOS ABS: 0.1 10*3/uL (ref 0.0–0.7)
Eosinophils Relative: 1 % (ref 0–5)
HEMATOCRIT: 41 % (ref 39.0–52.0)
HEMOGLOBIN: 13.9 g/dL (ref 13.0–17.0)
LYMPHS ABS: 2 10*3/uL (ref 0.7–4.0)
Lymphocytes Relative: 19 % (ref 12–46)
MCH: 29 pg (ref 26.0–34.0)
MCHC: 33.9 g/dL (ref 30.0–36.0)
MCV: 85.4 fL (ref 78.0–100.0)
MONOS PCT: 7 % (ref 3–12)
Monocytes Absolute: 0.7 10*3/uL (ref 0.1–1.0)
NEUTROS ABS: 7.7 10*3/uL (ref 1.7–7.7)
NEUTROS PCT: 73 % (ref 43–77)
Platelets: 469 10*3/uL — ABNORMAL HIGH (ref 150–400)
RBC: 4.8 MIL/uL (ref 4.22–5.81)
RDW: 12.7 % (ref 11.5–15.5)
WBC: 10.5 10*3/uL (ref 4.0–10.5)

## 2013-06-28 LAB — URINE MICROSCOPIC-ADD ON

## 2013-06-28 LAB — URINALYSIS, ROUTINE W REFLEX MICROSCOPIC
Bilirubin Urine: NEGATIVE
Glucose, UA: NEGATIVE mg/dL
Ketones, ur: 15 mg/dL — AB
Leukocytes, UA: NEGATIVE
NITRITE: NEGATIVE
PROTEIN: 100 mg/dL — AB
Specific Gravity, Urine: 1.031 — ABNORMAL HIGH (ref 1.005–1.030)
Urobilinogen, UA: 0.2 mg/dL (ref 0.0–1.0)
pH: 6 (ref 5.0–8.0)

## 2013-06-28 LAB — LIPASE, BLOOD: Lipase: 33 U/L (ref 11–59)

## 2013-06-28 MED ORDER — ONDANSETRON HCL 4 MG PO TABS: 4.0000 mg | ORAL_TABLET | Freq: Four times a day (QID) | ORAL | Status: DC

## 2013-06-28 MED ORDER — IOHEXOL 300 MG/ML  SOLN
100.0000 mL | Freq: Once | INTRAMUSCULAR | Status: AC | PRN
  Administered 2013-06-28: 100 mL via INTRAVENOUS

## 2013-06-28 MED ORDER — SODIUM CHLORIDE 0.9 % IV BOLUS (SEPSIS)
1000.0000 mL | Freq: Once | INTRAVENOUS | Status: AC
  Administered 2013-06-28: 1000 mL via INTRAVENOUS

## 2013-06-28 MED ORDER — SODIUM CHLORIDE 0.9 % IV BOLUS (SEPSIS)
500.0000 mL | Freq: Once | INTRAVENOUS | Status: AC
  Administered 2013-06-28: 500 mL via INTRAVENOUS

## 2013-06-28 MED ORDER — KETOROLAC TROMETHAMINE 30 MG/ML IJ SOLN
30.0000 mg | Freq: Once | INTRAMUSCULAR | Status: AC
  Administered 2013-06-28: 30 mg via INTRAVENOUS
  Filled 2013-06-28: qty 1

## 2013-06-28 MED ORDER — IOHEXOL 300 MG/ML  SOLN
25.0000 mL | Freq: Once | INTRAMUSCULAR | Status: AC | PRN
  Administered 2013-06-28: 25 mL via ORAL

## 2013-06-28 MED ORDER — TRAMADOL HCL 50 MG PO TABS: 50.0000 mg | ORAL_TABLET | Freq: Four times a day (QID) | ORAL | Status: DC | PRN

## 2013-06-28 NOTE — ED Provider Notes (Signed)
CSN: [id number]     Arrival date & time 06/28/13  0932 History   First MD Initiated Contact with Patient 06/28/13 1113     Chief Complaint  Patient presents with  . Abdominal Pain   (Consider location/radiation/quality/duration/timing/severity/associated sxs/prior Treatment) The history is provided by the patient. No language interpreter was used.  Mitchell Sparks is a 48 year old male with past medical history of hyperlipidemia presenting to emergency department with lower back pain and abdominal pain that has been ongoing for approximately 2 weeks. Patient reported that the low back pain is intermittent to the point where it just hurts really bad where he cannot move. Stated that if he does not take his ibuprofen the pain is really bad. Stated that the back pain is localized to the right lower aspect of the back. Patient reported that he's been having intermittent bloating sensations to the lower abdomen that occurs while at bedtime. Denied radiation of abdominal or back pain. Stated that for approximately 2 weeks he's been having intermittent fevers associated with his low back pain. Reported that approximately 2-3 weeks ago he had a temperature with back pain that lasted for approximately one week and went away-patient concerned because similar symptoms have returned but are lasting longer than normal. Patient reported that he's been having dry cough and a dry throat with minimal relief. Stated he was taking Lipitor for approximately a month and a half, stated that he was instructed to discontinue the medication by his primary care provider approximately one week ago. Denied numbness, tingling, urinary or bowel incontinence, fall, injury, nausea, vomiting, diarrhea, weakness, dizziness, headache, chest pain, short of breath, difficulty breathing, surgery. PCP Dr. Elizebeth Koller  Past Medical History  Diagnosis Date  . Hyperlipemia    Past Surgical History  Procedure Laterality Date  . Denies     Family  History  Problem Relation Age of Onset  . Diabetes    . Hyperlipidemia    . Hypertension    . Stroke     History  Substance Use Topics  . Smoking status: Former Research scientist (life sciences)  . Smokeless tobacco: Not on file  . Alcohol Use: Yes    Review of Systems  Constitutional: Positive for fever. Negative for chills.  HENT: Negative for trouble swallowing.   Respiratory: Negative for chest tightness and shortness of breath.   Cardiovascular: Negative for chest pain.  Gastrointestinal: Positive for abdominal pain. Negative for nausea, vomiting, diarrhea, constipation, blood in stool and anal bleeding.  Genitourinary: Negative for dysuria and decreased urine volume.  Musculoskeletal: Positive for back pain (Lower back pain).  Neurological: Negative for dizziness, weakness and numbness.  All other systems reviewed and are negative.    Allergies  Review of patient's allergies indicates no known allergies.  Home Medications   Current Outpatient Rx  Name  Route  Sig  Dispense  Refill  . ciprofloxacin (CIPRO) 500 MG tablet   Oral   Take 500 mg by mouth 2 (two) times daily. Day 3         . ibuprofen (ADVIL,MOTRIN) 200 MG tablet   Oral   Take 200 mg by mouth every 6 (six) hours as needed.         . ondansetron (ZOFRAN) 4 MG tablet   Oral   Take 1 tablet (4 mg total) by mouth every 6 (six) hours.   12 tablet   0   . traMADol (ULTRAM) 50 MG tablet   Oral   Take 1 tablet (50 mg total) by mouth  every 6 (six) hours as needed.   11 tablet   0    BP 116/76  Pulse 91  Temp(Src) 98.2 F (36.8 C) (Oral)  Resp 16  Ht 5\' 6"  (1.676 m)  Wt 163 lb 11.2 oz (74.254 kg)  BMI 26.43 kg/m2  SpO2 98% Physical Exam  Nursing note and vitals reviewed. Constitutional: He is oriented to person, place, and time. He appears well-developed and well-nourished. No distress.  HENT:  Head: Normocephalic and atraumatic.  Mouth/Throat: Oropharynx is clear and moist. No oropharyngeal exudate.  Eyes:  Conjunctivae and EOM are normal. Right eye exhibits no discharge. Left eye exhibits no discharge.  Neck: Normal range of motion. Neck supple. No tracheal deviation present.  Negative neck stiffness Negative nuchal rigidity Negative cervical lymphadenopathy Negative meningeal signs  Cardiovascular: Normal rate, regular rhythm and normal heart sounds.  Exam reveals no friction rub.   No murmur heard. Pulses:      Radial pulses are 2+ on the right side, and 2+ on the left side.       Dorsalis pedis pulses are 2+ on the right side, and 2+ on the left side.  Pulmonary/Chest: Effort normal and breath sounds normal. No respiratory distress. He has no wheezes. He has no rales.  Abdominal: Soft. Normal appearance and bowel sounds are normal. He exhibits no distension. There is tenderness in the right lower quadrant, suprapubic area and left lower quadrant. There is no guarding and no CVA tenderness.    Normal appearance to the abdomen Negative distention noted Bowel sounds normal active in all quadrants Discomfort upon palpation to bilateral lower quadrants-right lower quadrant, suprapubic and left lower quadrant-most discomfort noted upon palpation to right lower quadrant Soft upon palpation  Genitourinary:  Rectal sphincter tone strong. Negative bogginess, discomfort upon palpation to the prostate-prostate negative enlargement noted.  Musculoskeletal: Normal range of motion. He exhibits tenderness.       Back:  Negative deformities, erythema, formation, lesions, sores, bulging noted to the cervical/thoracic/lumbosacral/coccyx region of the mid spine. Discomfort upon palpation to lumbosacral/coccyx mid spinal and paraspinal regions bilaterally, most discomfort noted upon palpation to the right side. Full range of motion to upper extremities and lower extremities without difficulty or ataxia noted.  Lymphadenopathy:    He has no cervical adenopathy.  Neurological: He is alert and oriented to  person, place, and time. No cranial nerve deficit. He exhibits normal muscle tone. Coordination normal.  Cranial nerves III-XII grossly intact Strength 5+/5+ to upper and lower extremities bilaterally with resistance applied, equal distribution noted Negative arm drift Fine motor skills intact Sensation intact with differentiation to sharp and dull touch Negative bilateral saddle anesthesia Gait proper - negative sway or step-offs  Skin: Skin is warm and dry. No rash noted. He is not diaphoretic. No erythema.  Psychiatric: He has a normal mood and affect. His behavior is normal. Thought content normal.    ED Course  Procedures (including critical care time)  2:51 PM This provider had a long discussion with patient and wife regarding labs, imaging, vitals. Discussed plan for discharge. Patient and wife requesting labs and imaging to be printed out-this provider gave the labs and imaging result print outs to them in order for their primary care provider to have them on record. Patient and wife agreed to plan of discharge.  Results for orders placed during the hospital encounter of 06/28/13  CBC WITH DIFFERENTIAL      Result Value Range   WBC 10.5  4.0 - 10.5  K/uL   RBC 4.80  4.22 - 5.81 MIL/uL   Hemoglobin 13.9  13.0 - 17.0 g/dL   HCT 41.0  39.0 - 52.0 %   MCV 85.4  78.0 - 100.0 fL   MCH 29.0  26.0 - 34.0 pg   MCHC 33.9  30.0 - 36.0 g/dL   RDW 12.7  11.5 - 15.5 %   Platelets 469 (*) 150 - 400 K/uL   Neutrophils Relative % 73  43 - 77 %   Neutro Abs 7.7  1.7 - 7.7 K/uL   Lymphocytes Relative 19  12 - 46 %   Lymphs Abs 2.0  0.7 - 4.0 K/uL   Monocytes Relative 7  3 - 12 %   Monocytes Absolute 0.7  0.1 - 1.0 K/uL   Eosinophils Relative 1  0 - 5 %   Eosinophils Absolute 0.1  0.0 - 0.7 K/uL   Basophils Relative 0  0 - 1 %   Basophils Absolute 0.0  0.0 - 0.1 K/uL  COMPREHENSIVE METABOLIC PANEL      Result Value Range   Sodium 139  137 - 147 mEq/L   Potassium 4.4  3.7 - 5.3 mEq/L    Chloride 100  96 - 112 mEq/L   CO2 27  19 - 32 mEq/L   Glucose, Bld 106 (*) 70 - 99 mg/dL   BUN 15  6 - 23 mg/dL   Creatinine, Ser 1.02  0.50 - 1.35 mg/dL   Calcium 9.3  8.4 - 10.5 mg/dL   Total Protein 8.2  6.0 - 8.3 g/dL   Albumin 3.5  3.5 - 5.2 g/dL   AST 17  0 - 37 U/L   ALT 30  0 - 53 U/L   Alkaline Phosphatase 100  39 - 117 U/L   Total Bilirubin 0.5  0.3 - 1.2 mg/dL   GFR calc non Af Amer 85 (*) >90 mL/min   GFR calc Af Amer >90  >90 mL/min  URINALYSIS, ROUTINE W REFLEX MICROSCOPIC      Result Value Range   Color, Urine YELLOW  YELLOW   APPearance CLEAR  CLEAR   Specific Gravity, Urine 1.031 (*) 1.005 - 1.030   pH 6.0  5.0 - 8.0   Glucose, UA NEGATIVE  NEGATIVE mg/dL   Hgb urine dipstick MODERATE (*) NEGATIVE   Bilirubin Urine NEGATIVE  NEGATIVE   Ketones, ur 15 (*) NEGATIVE mg/dL   Protein, ur 100 (*) NEGATIVE mg/dL   Urobilinogen, UA 0.2  0.0 - 1.0 mg/dL   Nitrite NEGATIVE  NEGATIVE   Leukocytes, UA NEGATIVE  NEGATIVE  LIPASE, BLOOD      Result Value Range   Lipase 33  11 - 59 U/L  URINE MICROSCOPIC-ADD ON      Result Value Range   Squamous Epithelial / LPF RARE  RARE   WBC, UA 0-2  <3 WBC/hpf   RBC / HPF 3-6  <3 RBC/hpf   Bacteria, UA FEW (*) RARE   Urine-Other MUCOUS PRESENT     Dg Lumbar Spine Complete  06/28/2013   CLINICAL DATA:  Back pain for 1 month.  EXAM: LUMBAR SPINE - COMPLETE 4+ VIEW  COMPARISON:  CT, 06/28/2013.  FINDINGS: No fracture. No spondylolisthesis. The disc spaces and facet joints are well preserved. There are small endplate osteophytes from the lower thoracic spine through L5. No other degenerative change.  Soft tissues are unremarkable. There is generalized increased bowel gas.  IMPRESSION: No fracture or acute finding. Minor endplate  spurring. No other degenerative change.   Electronically Signed   By: Lajean Manes M.D.   On: 06/28/2013 13:55   Ct Abdomen Pelvis W Contrast  06/28/2013   CLINICAL DATA:  Abdominal bloating. Low-grade fevers.  Low back pain described as sharp. Viral syndrome approximately 1 month ago which subsequently resolved.  EXAM: CT ABDOMEN AND PELVIS WITH CONTRAST  TECHNIQUE: Multidetector CT imaging of the abdomen and pelvis was performed using the standard protocol following bolus administration of intravenous contrast.  CONTRAST:  145mL OMNIPAQUE IOHEXOL 300 MG/ML IV. Oral contrast was also administered.  COMPARISON:  None.  FINDINGS: Normal-appearing liver, spleen, pancreas, adrenal glands, and kidneys. 5 mm enhancing focus along the lateral wall of the gallbladder fundus (not in the dependent portion of the gallbladder). No biliary ductal dilation. No visible atherosclerosis. Patent visceral arteries. No significant lymphadenopathy.  Stomach relatively decompressed, accounting for the apparent generalized wall thickening. Normal-appearing small bowel. Very large stool burden in the cecum, ascending colon, and transverse colon. Descending colon, sigmoid colon and rectum without significant stool burden, though there is mild gaseous distention of the sigmoid colon. Normal appendix in the right mid and low pelvis. No ascites.  Urinary bladder unremarkable. Mild prostate gland enlargement, particularly the median lobe, the gland measuring approximately 4.1 x 4.4 x 4.6 cm. Normal seminal vesicles.  Schmorl's nodes involving the upper endplates of L1 and L2, causing an apparent sclerotic lesion in the L1 vertebral body. Calcification in the anterior annular fibers of the intervertebral discs at multiple thoracic and lumbar levels. Scarring involving the deep left lower lobe. Visualized lung bases otherwise clear. Heart size normal.  IMPRESSION: 1. No acute abnormalities involving the abdomen or pelvis. 2. Very large stool burden in the cecum, ascending colon and transverse colon. 3. 5 mm polyp involving the lateral wall of the gallbladder fundus. 4. Mild median lobe prostate gland enlargement.   Electronically Signed   By: Evangeline Dakin M.D.   On: 06/28/2013 13:05   Labs Review Labs Reviewed  CBC WITH DIFFERENTIAL - Abnormal; Notable for the following:    Platelets 469 (*)    All other components within normal limits  COMPREHENSIVE METABOLIC PANEL - Abnormal; Notable for the following:    Glucose, Bld 106 (*)    GFR calc non Af Amer 85 (*)    All other components within normal limits  URINALYSIS, ROUTINE W REFLEX MICROSCOPIC - Abnormal; Notable for the following:    Specific Gravity, Urine 1.031 (*)    Hgb urine dipstick MODERATE (*)    Ketones, ur 15 (*)    Protein, ur 100 (*)    All other components within normal limits  URINE MICROSCOPIC-ADD ON - Abnormal; Notable for the following:    Bacteria, UA FEW (*)    All other components within normal limits  LIPASE, BLOOD   Imaging Review Dg Lumbar Spine Complete  06/28/2013   CLINICAL DATA:  Back pain for 1 month.  EXAM: LUMBAR SPINE - COMPLETE 4+ VIEW  COMPARISON:  CT, 06/28/2013.  FINDINGS: No fracture. No spondylolisthesis. The disc spaces and facet joints are well preserved. There are small endplate osteophytes from the lower thoracic spine through L5. No other degenerative change.  Soft tissues are unremarkable. There is generalized increased bowel gas.  IMPRESSION: No fracture or acute finding. Minor endplate spurring. No other degenerative change.   Electronically Signed   By: Lajean Manes M.D.   On: 06/28/2013 13:55   Ct Abdomen Pelvis W Contrast  06/28/2013   CLINICAL DATA:  Abdominal bloating. Low-grade fevers. Low back pain described as sharp. Viral syndrome approximately 1 month ago which subsequently resolved.  EXAM: CT ABDOMEN AND PELVIS WITH CONTRAST  TECHNIQUE: Multidetector CT imaging of the abdomen and pelvis was performed using the standard protocol following bolus administration of intravenous contrast.  CONTRAST:  133mL OMNIPAQUE IOHEXOL 300 MG/ML IV. Oral contrast was also administered.  COMPARISON:  None.  FINDINGS: Normal-appearing liver,  spleen, pancreas, adrenal glands, and kidneys. 5 mm enhancing focus along the lateral wall of the gallbladder fundus (not in the dependent portion of the gallbladder). No biliary ductal dilation. No visible atherosclerosis. Patent visceral arteries. No significant lymphadenopathy.  Stomach relatively decompressed, accounting for the apparent generalized wall thickening. Normal-appearing small bowel. Very large stool burden in the cecum, ascending colon, and transverse colon. Descending colon, sigmoid colon and rectum without significant stool burden, though there is mild gaseous distention of the sigmoid colon. Normal appendix in the right mid and low pelvis. No ascites.  Urinary bladder unremarkable. Mild prostate gland enlargement, particularly the median lobe, the gland measuring approximately 4.1 x 4.4 x 4.6 cm. Normal seminal vesicles.  Schmorl's nodes involving the upper endplates of L1 and L2, causing an apparent sclerotic lesion in the L1 vertebral body. Calcification in the anterior annular fibers of the intervertebral discs at multiple thoracic and lumbar levels. Scarring involving the deep left lower lobe. Visualized lung bases otherwise clear. Heart size normal.  IMPRESSION: 1. No acute abnormalities involving the abdomen or pelvis. 2. Very large stool burden in the cecum, ascending colon and transverse colon. 3. 5 mm polyp involving the lateral wall of the gallbladder fundus. 4. Mild median lobe prostate gland enlargement.   Electronically Signed   By: Evangeline Dakin M.D.   On: 06/28/2013 13:05    EKG Interpretation   None       MDM   1. Nephrolithiasis   2. Back pain     Medications  sodium chloride 0.9 % bolus 1,000 mL (0 mLs Intravenous Stopped 06/28/13 1425)  iohexol (OMNIPAQUE) 300 MG/ML solution 25 mL (25 mLs Oral Contrast Given 06/28/13 1209)  iohexol (OMNIPAQUE) 300 MG/ML solution 100 mL (100 mLs Intravenous Contrast Given 06/28/13 1231)  ketorolac (TORADOL) 30 MG/ML injection 30  mg (30 mg Intravenous Given 06/28/13 1449)  sodium chloride 0.9 % bolus 500 mL (500 mLs Intravenous New Bag/Given 06/28/13 1449)   Filed Vitals:   06/28/13 1215 06/28/13 1300 06/28/13 1315 06/28/13 1330  BP: 113/73 119/80 118/78 116/76  Pulse: 85 90 96 91  Temp:      TempSrc:      Resp:      Height:      Weight:      SpO2: 100% 100% 100% 98%    Patient presenting to emergency department with low back pain and lower abdominal pain does been ongoing for approximately 2 weeks. Patient reported that the lower back pain is a continuous discomfort, worse in the mornings-reported that if he does not take ibuprofen the pain is bad. Denied fall or recent injury, denied bowel incontinence or urinary incontinence. Reported lower abdominal pain that is not associated with lower back pain. Reported that the lower abdominal pain as a bloating sensation without radiation. Patient reported that he's been having intermittent fevers ranging anywhere from approximately 100F over the past 2 weeks. Reported that he was seen by his primary care provider who scheduled a CT abdomen and pelvis to be performed next week-patient reported  that he woke up this morning with intense pain and could not wait so came to the ED. Alert and oriented. GCS 15. Heart rate and rhythm normal. Lungs clear to auscultation. Radial and DP pulses 2+ bilaterally. Bowel sounds normal active in all 4 quadrants soft, negative distention noted. Discomfort upon palpation to right lower, left lower and suprapubic regions. Negative acute abdomen, negative peritoneal signs-nonsurgical abdomen noted. Negative CVA tenderness. Negative deformities noted to the cervical/thoracic/lumbosacral says coccyx spine. Discomfort upon palpation to mid spinal and bilateral paraspinal regions of the lumbosacral/coccyx region-most discomfort upon the right side. Full range of motion to upper extremities bilaterally and lower extremities bilaterally without difficulty.  Sensation intact. Negative bilateral saddle paresthesias. Strength intact with equal distribution. Negative swelling or pitting edema noted to bilateral extremities. Strong rectal sphincter tone - negative enlargement or bogginess noted to the prostate. CBC negative elevation white blood cell count noted-negative findings. CMP negative findings. Urine noted moderate hemoglobin with no trace of leukocytes or nitrites-negative pyuria. Lipase negative elevation. Lumbar plain film noted minor endplate spurring with negative acute bony abnormalities. CT abdomen and pelvis with contrast noted no acute abdominal abnormalities. Large stool burden in the cecum, ascending colon and transverse colon - 5 mm polyp involving the lateral wall of the gallbladder fundus. Mild median lobe prostate gland enlargement.  Doubt cauda equina. Doubt epidural abscess. Doubt meningitis. Doubt pyelonephritis. Doubt pancreatitis. Non-surgical abdomen. Negative findings of hydronephrosis. Cannot rule out nephrolithiasis. Patient not septic. Pain controlled in ED setting. Discharged patient. Discharged patient with small dose of pain medications - discussed course, precautions, disposal technique. Discharged patient with urine strainer. Discussed with patient to rest and stay hydrated, to drink plenty of water. Referred patient to PCP - recommended urine to be repeated within one week. Discussed with patient to closely monitor symptoms and if symptoms are to worsen or change to report back to the ED - strict return instructions given.  Patient agreed to plan of care, understood, all questions answered.   Jamse Mead, PA-C 06/29/13 1153

## 2013-06-28 NOTE — Discharge Instructions (Signed)
Please call your doctor for a followup appointment within 24-48 hours. When you talk to your doctor please let them know that you were seen in the emergency department and have them acquire all of your records so that they can discuss the findings with you and formulate a treatment plan to fully care for your new and ongoing problems. Please call and set-up an appointment with your primary care provider Please rest and stay hydrated Please use urine strainer with each urination to catch the stone if passed Please get urine re-checked within one week to see if blood is still present. If blood is still present please call and set up an appointment with urology Please take medications as prescribed - zofran for nausea, tramadol for pain. While on pain medications there is to be no drinking alcohol, driving, operating any heavy machinery - if there is extra please dispose in a proper manner.  Please continue to monitor symptoms and if symptoms are to worsen or change (fever greater than 101, chills, neck pain, neck stiffness, chest pain, shortness of breath, difficulty breathing, weakness, worsening or changes to back pain, inability to urinate, nausea, vomiting, diarrhea, stomach pain, black tarry stools) please report back to the ED immediately  Kidney Stones Kidney stones (urolithiasis) are deposits that form inside your kidneys. The intense pain is caused by the stone moving through the urinary tract. When the stone moves, the ureter goes into spasm around the stone. The stone is usually passed in the urine.  CAUSES   A disorder that makes certain neck glands produce too much parathyroid hormone (primary hyperparathyroidism).  A buildup of uric acid crystals, similar to gout in your joints.  Narrowing (stricture) of the ureter.  A kidney obstruction present at birth (congenital obstruction).  Previous surgery on the kidney or ureters.  Numerous kidney infections. SYMPTOMS   Feeling sick to  your stomach (nauseous).  Throwing up (vomiting).  Blood in the urine (hematuria).  Pain that usually spreads (radiates) to the groin.  Frequency or urgency of urination. DIAGNOSIS   Taking a history and physical exam.  Blood or urine tests.  CT scan.  Occasionally, an examination of the inside of the urinary bladder (cystoscopy) is performed. TREATMENT   Observation.  Increasing your fluid intake.  Extracorporeal shock wave lithotripsy This is a noninvasive procedure that uses shock waves to break up kidney stones.  Surgery may be needed if you have severe pain or persistent obstruction. There are various surgical procedures. Most of the procedures are performed with the use of small instruments. Only small incisions are needed to accommodate these instruments, so recovery time is minimized. The size, location, and chemical composition are all important variables that will determine the proper choice of action for you. Talk to your health care provider to better understand your situation so that you will minimize the risk of injury to yourself and your kidney.  HOME CARE INSTRUCTIONS   Drink enough water and fluids to keep your urine clear or pale yellow. This will help you to pass the stone or stone fragments.  Strain all urine through the provided strainer. Keep all particulate matter and stones for your health care provider to see. The stone causing the pain may be as small as a grain of salt. It is very important to use the strainer each and every time you pass your urine. The collection of your stone will allow your health care provider to analyze it and verify that a stone has actually  passed. The stone analysis will often identify what you can do to reduce the incidence of recurrences.  Only take over-the-counter or prescription medicines for pain, discomfort, or fever as directed by your health care provider.  Make a follow-up appointment with your health care provider as  directed.  Get follow-up X-rays if required. The absence of pain does not always mean that the stone has passed. It may have only stopped moving. If the urine remains completely obstructed, it can cause loss of kidney function or even complete destruction of the kidney. It is your responsibility to make sure X-rays and follow-ups are completed. Ultrasounds of the kidney can show blockages and the status of the kidney. Ultrasounds are not associated with any radiation and can be performed easily in a matter of minutes. SEEK MEDICAL CARE IF:  You experience pain that is progressive and unresponsive to any pain medicine you have been prescribed. SEEK IMMEDIATE MEDICAL CARE IF:   Pain cannot be controlled with the prescribed medicine.  You have a fever or shaking chills.  The severity or intensity of pain increases over 18 hours and is not relieved by pain medicine.  You develop a new onset of abdominal pain.  You feel faint or pass out.  You are unable to urinate. MAKE SURE YOU:   Understand these instructions.  Will watch your condition.  Will get help right away if you are not doing well or get worse. Document Released: 05/14/2005 Document Revised: 01/14/2013 Document Reviewed: 10/15/2012 [facility] Patient Information 2014 [place].   Emergency Department Resource Guide 1) Find a Doctor and Pay Out of Pocket Although you won't have to find out who is covered by your insurance plan, it is a good idea to ask around and get recommendations. You will then need to call the office and see if the doctor you have chosen will accept you as a new patient and what types of options they offer for patients who are self-pay. Some doctors offer discounts or will set up payment plans for their patients who do not have insurance, but you will need to ask so you aren't surprised when you get to your appointment.  2) Contact Your Local Health Department Not all health departments have doctors  that can see patients for sick visits, but many do, so it is worth a call to see if yours does. If you don't know where your local health department is, you can check in your phone book. The CDC also has a tool to help you locate your state's health department, and many state websites also have listings of all of their local health departments.  3) Find a [facility] If your illness is not likely to be very severe or complicated, you may want to try a walk in clinic. These are popping up all over the country in pharmacies, drugstores, and shopping centers. They're usually staffed by nurse practitioners or physician assistants that have been trained to treat common illnesses and complaints. They're usually fairly quick and inexpensive. However, if you have serious medical issues or chronic medical problems, these are probably not your best option.  No Primary Care Doctor: - Call Health Connect at  [phone] - they can help you locate a primary care doctor that  accepts your insurance, provides certain services, etc. - Physician Referral Service- [phone]  Chronic Pain Problems: Organization         Address  Phone   Notes  [facility]  [phone]  Patients need to be referred by their primary care doctor.   Medication Assistance: Organization         Address  Phone   Notes  [facility] Medication [facility] [address] [place], [place] [address] [phone] --Must be a resident of [facility] -- Must have NO insurance coverage whatsoever (no Medicaid/ Medicare, etc.) -- The pt. MUST have a primary care doctor that directs their care regularly and follows them in the community   MedAssist  [phone]   [employer]  [phone]    Agencies that provide inexpensive medical care: Organization         Address  Phone   Notes  [place]  [phone]   [facility]    [phone]   [facility] [place], [place] [address] [phone]   [place] [address], [place] [phone]   Planned Parenthood    [phone]   [facility]    [phone]   [place] and [place] [address], [place] Phone:  [phone], Fax:  [phone] Hours of Operation:  9 am - 6 pm, M-F.  Also accepts Medicaid/Medicare and self-pay.  [facility] [place], [address], [place] Phone: [phone], Fax: [phone]. Hours of Operation:  8:30 am - 5:30 pm, M-F.  Also accepts Medicaid and self-pay.  [facility] [place] [address], [place] Phone: [phone]   [place], [place], [place] [phone], Ext. [phone] Mondays & Thursdays: 7-9 AM.  First 15 patients are seen on a first come, first serve basis.    [place] Providers:  Organization         Address  Phone   Notes  [facility] [address], [place] [phone] Also accepts self-pay patients.  [facility] [id number], [place]  [phone]   [place], [address], [place] [phone]   [facility] [address], [place] [phone]   Lucianne Lei [address], [place]   [phone] Only accepts [place] Access [place] patients after they have their name applied to their card.   Self-Pay (no insurance) in [facility]:  Organization         Address  Phone   Notes  Sickle Cell Patients, [facility] [place] [phone]   [facility] [phone]   [facility] [place]  [place], [place], [place] [phone]   [facility]/Dr.  Osei-Bonsu  [address], [place] or [address], [place] [phone] Phone number for both [place] and [place] locations is the same.  Urgent Medical and [facility] [address], [place] [phone]   [facility] [address], [place] or [address] [phone]   [facility] [address] [place] [phone], phone; [phone], fax Sees patients 1st and 3rd Saturday of every month.  Must not qualify for public or private insurance (i.e. Medicaid, Medicare, [place] Health Choice, Veterans' Benefits)  Household  income should be no more than 200% of the poverty level The clinic cannot treat you if you are pregnant or think you are pregnant  Sexually transmitted diseases are not treated at the clinic.    Dental Care: Organization         Address  Phone  Notes  [facility] Department of [facility] [place] [phone] Accepts children up to age 71 who are enrolled in [place] or [place]; pregnant women with a Medicaid card; and children who have applied for Medicaid or [place] Health Choice, but were declined, whose parents can pay a reduced fee at time of service.  [facility] Department of [facility] [place]  [address], [place] [phone] Accepts children up to age 58 who are enrolled in [place] or [place]; pregnant women with a Medicaid card; and children who have applied for Medicaid or [place] Health Choice, but were declined, whose parents can pay a reduced fee at time of service.  [place] Adult Dental Access PROGRAM  [place] [phone] Patients are seen by appointment only. Walk-ins are not accepted. [place] will see patients 49 years of age and older. Monday - Tuesday (8am-5pm) Most Wednesdays (8:30-5pm) $30 per visit, cash only  [facility] Adult  Dental Access PROGRAM  [address], [facility] [phone] Patients are seen by appointment only. Walk-ins are not accepted. [place] will see patients 52 years of age and older. One Wednesday Evening (Monthly: Volunteer Based).  $30 per visit, cash only  [place]  [phone] for adults; Children under age 49, call Graduate Pediatric Dentistry at [phone]. Children aged 6-14, please call [phone] to request a pediatric application.  Dental services are provided in all areas of dental care including fillings, crowns and bridges, complete and partial dentures, implants, gum treatment, root canals, and extractions. Preventive care is also provided. Treatment is provided to both adults and children. Patients are selected via a lottery and there is often a waiting list.   [facility] [address] [place]  [phone] [email or web address]   [facility] [address] [place], [place] [phone], Ext. [phone] Second and Fourth Thursday of each month, opens at 6:30 AM; Clinic ends at 9 AM.  Patients are seen on a first-come first-served basis, and a limited number are seen during each clinic.   [facility]  [address] Hillard Danker [place], [place] [phone]   Eligibility Requirements You must have lived in [place], [place], or [place] counties for at least the last three months.   You cannot be eligible for state or federal sponsored [employer], including [employer], [place], or [employer].   You generally cannot be eligible for healthcare insurance through your employer.    How to apply: Eligibility screenings are held every Tuesday and Wednesday afternoon from 1:00 pm until 4:00 pm. You do not need an appointment for the interview!  [facility] [address], [place], [place]   [place]  [place] Department  [place]  [phone]    Behavioral Health Resources in the Community: Intensive Outpatient Programs Organization         Address  Phone  Notes  [place]. [address] [place], [place]  [phone]   [facility]patient [address], [place], [place]   ADS: Alcohol [facility] Drug [facility] [address] [place], [place]   [place] [address]  [place], [place] or [phone]   Substance Abuse Resources Organization         Address  Phone  Notes  Alcohol and Drug Services  [phone]   [place]  [phone]   [facility] [place]   Chinita Pester  [phone]   Residential & Outpatient Substance Abuse Program  [phone]   Psychological Services Organization         Address  Phone  Notes  [facility] [place]  [place]  [phone]   [place] [address], [place] or [phone]    Mobile Crisis Teams Organization         Address  Phone  Notes  [facility], Mobile Crisis Care Unit  [phone]   [facility]  [address] [place], [place]   [place] [address], [place] [phone]    Self-Help/Support Groups Organization         Address  Phone             Notes  [place]. of [place] - variety of support groups  [place] Call for more information  Narcotics Anonymous (NA), [facility] [address], [employer] [place]  2 meetings at this location   Special educational needs teacher         Address  Phone  Notes  [facility] Residential Treatment [place],    [place]  [phone]-(671)[phone]   [facility]  [address], [place] [address], [place], [place]   [place], [place] [phone] Admissions: 8am-3pm M-F  Incentives Substance [place] [address],    [place], [place] [phone]   [facility] [address] [place], [place], [place]   [facility] [address],  [place], [place]   [facility]ight Programs - Intensive Outpatient [place] [address], [place], [place]   [facility] ([place] [place].,  [place], [place] [phone] or [phone]   [facility] (RTS) [address], [place], [place] Accepts Medicaid  [facility] [place] [address]  [place] 1-6034044179 Substance Abuse/Addiction Treatment   [facility] Organization         Address  Phone  Notes  [facility]  5712437598   Domenic Schwab, PhD [address] Arlis Porta [place], [place]   [phone] or [phone]   [place] [address] [place], [place] [phone]   [facility] [address] [place], [place] [phone] Insurance/Medicaid/sponsorship through [facility] and Families [address]                                    [place], [place] [phone] [place] [address], [place] [phone]    Dr. Adele Schilder  [phone]   [facility] [place] Dept. 1) [address], [place] 2) [place],  [place] 3)  [address] [address], [place] [phone]  [phone]   [facility] Child Abuse Hotline 857-723-2272 or [phone] (After Hours)

## 2013-06-28 NOTE — ED Notes (Addendum)
Pt reports he had flu like symptoms several weeks ago which resolved. Since then hes had increasingly worse abd bloating, low grade fevers, and low back pain. His pcp placed him on abx and scheduled a CT abd next week but the symptoms were so severe this am he decided to come to ER for treatment today. He took ibuprofen this am which helped the fever and pain.

## 2013-06-29 NOTE — ED Provider Notes (Signed)
Medical screening examination/treatment/procedure(s) were performed by non-physician practitioner and as supervising physician I was immediately available for consultation/collaboration.  EKG Interpretation   None         Ephraim Hamburger, MD 06/29/13 1524

## 2013-06-30 ENCOUNTER — Other Ambulatory Visit: Payer: BC Managed Care – PPO

## 2013-08-21 ENCOUNTER — Other Ambulatory Visit: Payer: Self-pay | Admitting: Rheumatology

## 2013-08-21 DIAGNOSIS — R102 Pelvic and perineal pain: Secondary | ICD-10-CM

## 2013-08-26 ENCOUNTER — Ambulatory Visit
Admission: RE | Admit: 2013-08-26 | Discharge: 2013-08-26 | Disposition: A | Discharge status: Home / Self Care | Payer: BC Managed Care – PPO | Source: Ambulatory Visit | Attending: Rheumatology | Admitting: Rheumatology

## 2013-08-26 DIAGNOSIS — R102 Pelvic and perineal pain: Secondary | ICD-10-CM

## 2013-08-26 MED ORDER — GADOBENATE DIMEGLUMINE 529 MG/ML IV SOLN
15.0000 mL | Freq: Once | INTRAVENOUS | Status: AC | PRN
  Administered 2013-08-26: 15 mL via INTRAVENOUS

## 2013-08-27 ENCOUNTER — Encounter (HOSPITAL_COMMUNITY): Payer: Self-pay | Admitting: Emergency Medicine

## 2013-08-27 ENCOUNTER — Inpatient Hospital Stay (HOSPITAL_COMMUNITY): Payer: BC Managed Care – PPO

## 2013-08-27 ENCOUNTER — Inpatient Hospital Stay (HOSPITAL_COMMUNITY)
Admission: EM | Admit: 2013-08-27 | Discharge: 2013-08-31 | DRG: 520 | Disposition: A | Discharge status: Home Health | Payer: BC Managed Care – PPO | Attending: Internal Medicine | Admitting: Internal Medicine

## 2013-08-27 DIAGNOSIS — Z0000 Encounter for general adult medical examination without abnormal findings: Secondary | ICD-10-CM

## 2013-08-27 DIAGNOSIS — M25551 Pain in right hip: Secondary | ICD-10-CM

## 2013-08-27 DIAGNOSIS — M4620 Osteomyelitis of vertebra, site unspecified: Secondary | ICD-10-CM | POA: Diagnosis present

## 2013-08-27 DIAGNOSIS — A048 Other specified bacterial intestinal infections: Secondary | ICD-10-CM

## 2013-08-27 DIAGNOSIS — M8618 Other acute osteomyelitis, other site: Principal | ICD-10-CM | POA: Diagnosis present

## 2013-08-27 DIAGNOSIS — M25559 Pain in unspecified hip: Secondary | ICD-10-CM

## 2013-08-27 DIAGNOSIS — M519 Unspecified thoracic, thoracolumbar and lumbosacral intervertebral disc disorder: Secondary | ICD-10-CM | POA: Diagnosis present

## 2013-08-27 DIAGNOSIS — Z113 Encounter for screening for infections with a predominantly sexual mode of transmission: Secondary | ICD-10-CM

## 2013-08-27 DIAGNOSIS — M4646 Discitis, unspecified, lumbar region: Secondary | ICD-10-CM | POA: Diagnosis present

## 2013-08-27 DIAGNOSIS — A4901 Methicillin susceptible Staphylococcus aureus infection, unspecified site: Secondary | ICD-10-CM

## 2013-08-27 DIAGNOSIS — M25552 Pain in left hip: Secondary | ICD-10-CM

## 2013-08-27 DIAGNOSIS — G4700 Insomnia, unspecified: Secondary | ICD-10-CM

## 2013-08-27 DIAGNOSIS — Z87891 Personal history of nicotine dependence: Secondary | ICD-10-CM

## 2013-08-27 DIAGNOSIS — E785 Hyperlipidemia, unspecified: Secondary | ICD-10-CM

## 2013-08-27 DIAGNOSIS — R21 Rash and other nonspecific skin eruption: Secondary | ICD-10-CM

## 2013-08-27 LAB — CBC WITH DIFFERENTIAL/PLATELET
Basophils Absolute: 0 10*3/uL (ref 0.0–0.1)
Basophils Relative: 0 % (ref 0–1)
Eosinophils Absolute: 0.1 10*3/uL (ref 0.0–0.7)
Eosinophils Relative: 1 % (ref 0–5)
HCT: 37.6 % — ABNORMAL LOW (ref 39.0–52.0)
Hemoglobin: 12.1 g/dL — ABNORMAL LOW (ref 13.0–17.0)
Lymphocytes Relative: 21 % (ref 12–46)
Lymphs Abs: 2.5 10*3/uL (ref 0.7–4.0)
MCH: 26.2 pg (ref 26.0–34.0)
MCHC: 32.2 g/dL (ref 30.0–36.0)
MCV: 81.4 fL (ref 78.0–100.0)
Monocytes Absolute: 1 10*3/uL (ref 0.1–1.0)
Monocytes Relative: 9 % (ref 3–12)
Neutro Abs: 8.4 10*3/uL — ABNORMAL HIGH (ref 1.7–7.7)
Neutrophils Relative %: 69 % (ref 43–77)
Platelets: 423 10*3/uL — ABNORMAL HIGH (ref 150–400)
RBC: 4.62 MIL/uL (ref 4.22–5.81)
RDW: 14.3 % (ref 11.5–15.5)
WBC: 12.1 10*3/uL — ABNORMAL HIGH (ref 4.0–10.5)

## 2013-08-27 LAB — BASIC METABOLIC PANEL
BUN: 12 mg/dL (ref 6–23)
CO2: 26 mEq/L (ref 19–32)
Calcium: 9.7 mg/dL (ref 8.4–10.5)
Chloride: 100 mEq/L (ref 96–112)
Creatinine, Ser: 0.75 mg/dL (ref 0.50–1.35)
GFR calc Af Amer: >90 mL/min (ref >90)
GFR calc non Af Amer: >90 mL/min (ref >90)
Glucose, Bld: 82 mg/dL (ref 70–99)
Potassium: 4.3 mEq/L (ref 3.7–5.3)
Sodium: 139 mEq/L (ref 137–147)

## 2013-08-27 LAB — SEDIMENTATION RATE: Sed Rate: 128 mm/hr — ABNORMAL HIGH (ref 0–16)

## 2013-08-27 LAB — C-REACTIVE PROTEIN: CRP: 6.8 mg/dL — ABNORMAL HIGH (ref <0.60)

## 2013-08-27 LAB — PROTIME-INR
INR: 0.98 (ref 0.00–1.49)
PROTHROMBIN TIME: 12.8 s (ref 11.6–15.2)

## 2013-08-27 MED ORDER — SENNA 8.6 MG PO TABS
1.0000 | ORAL_TABLET | Freq: Two times a day (BID) | ORAL | Status: DC
  Administered 2013-08-27 – 2013-08-31 (×8): 8.6 mg via ORAL
  Filled 2013-08-27 (×9): qty 1

## 2013-08-27 MED ORDER — FENTANYL CITRATE 0.05 MG/ML IJ SOLN
INTRAMUSCULAR | Status: AC
  Filled 2013-08-27: qty 2

## 2013-08-27 MED ORDER — ONDANSETRON HCL 4 MG/2ML IJ SOLN: 4.0000 mg | Freq: Four times a day (QID) | INTRAMUSCULAR | Status: DC | PRN

## 2013-08-27 MED ORDER — ONDANSETRON HCL 4 MG PO TABS: 4.0000 mg | ORAL_TABLET | Freq: Four times a day (QID) | ORAL | Status: DC | PRN

## 2013-08-27 MED ORDER — MIDAZOLAM HCL 2 MG/2ML IJ SOLN
INTRAMUSCULAR | Status: AC | PRN
  Administered 2013-08-27 (×2): 1 mg via INTRAVENOUS

## 2013-08-27 MED ORDER — ACETAMINOPHEN 325 MG PO TABS
650.0000 mg | ORAL_TABLET | Freq: Four times a day (QID) | ORAL | Status: DC | PRN
  Administered 2013-08-28 – 2013-08-31 (×3): 650 mg via ORAL
  Filled 2013-08-27 (×3): qty 2

## 2013-08-27 MED ORDER — OXYCODONE HCL 5 MG PO TABS
5.0000 mg | ORAL_TABLET | ORAL | Status: DC | PRN
  Administered 2013-08-28 – 2013-08-31 (×11): 10 mg via ORAL
  Filled 2013-08-27 (×11): qty 2

## 2013-08-27 MED ORDER — FENTANYL CITRATE 0.05 MG/ML IJ SOLN
INTRAMUSCULAR | Status: AC | PRN
  Administered 2013-08-27 (×3): 25 ug via INTRAVENOUS

## 2013-08-27 MED ORDER — MORPHINE SULFATE 4 MG/ML IJ SOLN
4.0000 mg | Freq: Once | INTRAMUSCULAR | Status: AC
  Administered 2013-08-27: 4 mg via INTRAVENOUS
  Filled 2013-08-27: qty 1

## 2013-08-27 MED ORDER — SODIUM CHLORIDE 0.9 % IV SOLN
INTRAVENOUS | Status: DC
  Administered 2013-08-27: 100 mL/h via INTRAVENOUS

## 2013-08-27 MED ORDER — ENOXAPARIN SODIUM 40 MG/0.4ML ~~LOC~~ SOLN
40.0000 mg | SUBCUTANEOUS | Status: DC
  Administered 2013-08-27 – 2013-08-30 (×4): 40 mg via SUBCUTANEOUS
  Filled 2013-08-27 (×5): qty 0.4

## 2013-08-27 MED ORDER — ACETAMINOPHEN 650 MG RE SUPP: 650.0000 mg | Freq: Four times a day (QID) | RECTAL | Status: DC | PRN

## 2013-08-27 MED ORDER — MIDAZOLAM HCL 2 MG/2ML IJ SOLN
INTRAMUSCULAR | Status: AC
  Filled 2013-08-27: qty 4

## 2013-08-27 MED ORDER — MORPHINE SULFATE 2 MG/ML IJ SOLN
1.0000 mg | INTRAMUSCULAR | Status: DC | PRN
  Administered 2013-08-27 (×2): 2 mg via INTRAVENOUS
  Filled 2013-08-27 (×2): qty 1

## 2013-08-27 MED ORDER — ONDANSETRON HCL 4 MG/2ML IJ SOLN
4.0000 mg | Freq: Once | INTRAMUSCULAR | Status: AC
  Administered 2013-08-27: 4 mg via INTRAVENOUS
  Filled 2013-08-27: qty 2

## 2013-08-27 MED ORDER — BISACODYL 5 MG PO TBEC: 10.0000 mg | DELAYED_RELEASE_TABLET | Freq: Every day | ORAL | Status: DC | PRN

## 2013-08-27 NOTE — ED Notes (Signed)
Patient states he was send here by Dr. Ouida Sills.  States he had an MRI and it suggested he may have an infection.  Patient has been having lower back pain and pain in the joints since January.

## 2013-08-27 NOTE — Procedures (Signed)
S/P fluoro guided disc aspiration at L4-L5  For discitis.

## 2013-08-27 NOTE — ED Notes (Signed)
Patient taken to IR

## 2013-08-27 NOTE — H&P (Signed)
Chief Complaint: "Back pain." Referring Physician: Dr. Wilson Singer HPI: Mitchell Sparks is an 48 y.o. male who presents to ED today complaining of lower back pain with distal muscle pain down his extremities bilaterally that started in December last for 2 weeks with associated fever and then subsided. The pain reoccurred again mid January and has been persistent. He complains of 10/10 pain today with fevers, night sweats, and electric shock sensation down his legs bilaterally. He denies any extremity weakness or numbness. He does admit to a cough, denies any exposure to TB that he is aware of. MRI today revealed L4-L5 discitis/osteomyelitis. IR received request for image guided disc aspiration. He denies any chest pain, shortness of breath or palpitations. He denies any active signs of bleeding or excessive bruising. The patient denies any history of sleep apnea or chronic oxygen use. He has previously tolerated propafol without complications and is unsure if he has had versed or fentanyl. He denies any history of diabetes or known infection at the time his pain started in December.  Past Medical History:  Past Medical History  Diagnosis Date  . Hyperlipemia     Past Surgical History:  Past Surgical History  Procedure Laterality Date  . Denies      Family History:  Family History  Problem Relation Age of Onset  . Diabetes    . Hyperlipidemia    . Hypertension    . Stroke      Social History:  reports that he has quit smoking. He does not have any smokeless tobacco history on file. He reports that he drinks alcohol. He reports that he does not use illicit drugs.  Allergies: No Known Allergies  Medications:   Medication List    ASK your doctor about these medications       acetaminophen 500 MG tablet  Commonly known as:  TYLENOL  Take 1,000 mg by mouth every 8 (eight) hours as needed.     ibuprofen 800 MG tablet  Commonly known as:  ADVIL,MOTRIN  Take 800 mg by mouth 3 (three) times  daily.       Please HPI for pertinent positives, otherwise complete 10 system ROS negative.  Physical Exam: BP 125/78  Pulse 90  Temp(Src) 98.3 F (36.8 C) (Oral)  Resp 16  Ht $R'5\' 6"'XS$  (1.676 m)  Wt 160 lb (72.576 kg)  BMI 25.84 kg/m2  SpO2 99% Body mass index is 25.84 kg/(m^2).  General Appearance:  Alert, cooperative, no distress  Head:  Normocephalic, without obvious abnormality, atraumatic  Neck: Supple, symmetrical, trachea midline  Lungs:   Clear to auscultation bilaterally, no w/r/r, respirations unlabored without use of accessory muscles.  Chest Wall:  No tenderness or deformity  Heart:  Regular rate and rhythm, S1, S2 normal, no murmur, rub or gallop.  Abdomen:   Soft, non-tender, non distended, (+) BS  Extremities: Extremities normal, atraumatic, no cyanosis or edema  Pulses: 2+ and symmetric  Neurologic: Normal affect, no gross deficits.   Results for orders placed during the hospital encounter of 08/27/13 (from the past 48 hour(s))  CBC WITH DIFFERENTIAL     Status: Abnormal   Collection Time    08/27/13 10:30 AM      Result Value Ref Range   WBC 12.1 (*) 4.0 - 10.5 K/uL   RBC 4.62  4.22 - 5.81 MIL/uL   Hemoglobin 12.1 (*) 13.0 - 17.0 g/dL   HCT 37.6 (*) 39.0 - 52.0 %   MCV 81.4  78.0 - 100.0 fL  MCH 26.2  26.0 - 34.0 pg   MCHC 32.2  30.0 - 36.0 g/dL   RDW 14.3  11.5 - 15.5 %   Platelets 423 (*) 150 - 400 K/uL   Neutrophils Relative % 69  43 - 77 %   Neutro Abs 8.4 (*) 1.7 - 7.7 K/uL   Lymphocytes Relative 21  12 - 46 %   Lymphs Abs 2.5  0.7 - 4.0 K/uL   Monocytes Relative 9  3 - 12 %   Monocytes Absolute 1.0  0.1 - 1.0 K/uL   Eosinophils Relative 1  0 - 5 %   Eosinophils Absolute 0.1  0.0 - 0.7 K/uL   Basophils Relative 0  0 - 1 %   Basophils Absolute 0.0  0.0 - 0.1 K/uL  BASIC METABOLIC PANEL     Status: None   Collection Time    08/27/13 10:30 AM      Result Value Ref Range   Sodium 139  137 - 147 mEq/L   Potassium 4.3  3.7 - 5.3 mEq/L   Chloride  100  96 - 112 mEq/L   CO2 26  19 - 32 mEq/L   Glucose, Bld 82  70 - 99 mg/dL   BUN 12  6 - 23 mg/dL   Creatinine, Ser 0.75  0.50 - 1.35 mg/dL   Calcium 9.7  8.4 - 10.5 mg/dL   GFR calc non Af Amer >90  >90 mL/min   GFR calc Af Amer >90  >90 mL/min   Comment: (NOTE)     The eGFR has been calculated using the CKD EPI equation.     This calculation has not been validated in all clinical situations.     eGFR's persistently <90 mL/min signify possible Chronic Kidney     Disease.  SEDIMENTATION RATE     Status: Abnormal   Collection Time    08/27/13 10:30 AM      Result Value Ref Range   Sed Rate 128 (*) 0 - 16 mm/hr  PROTIME-INR     Status: None   Collection Time    08/27/13 12:47 PM      Result Value Ref Range   Prothrombin Time 12.8  11.6 - 15.2 seconds   INR 0.98  0.00 - 1.49   Dg Chest 2 View  08/27/2013   CLINICAL DATA:  Leukocytosis  EXAM: CHEST  2 VIEW  COMPARISON:  None.  FINDINGS: Lungs are clear. Heart size and pulmonary vascularity are normal. No adenopathy. No bone lesions.  IMPRESSION: No abnormality noted.   Electronically Signed   By: Lowella Grip M.D.   On: 08/27/2013 13:37   Mr Pelvis W Wo Contrast  08/27/2013   CLINICAL DATA:  Flu like symptoms. Severe pelvic pelvic pain and hip pain.  EXAM: MRI PELVIS WITHOUT AND WITH CONTRAST  TECHNIQUE: Multiplanar multisequence MR imaging of the pelvis was performed both before and after administration of intravenous contrast.  CONTRAST:  23mL MULTIHANCE GADOBENATE DIMEGLUMINE 529 MG/ML IV SOLN  COMPARISON:  DG LUMBAR SPINE COMPLETE dated 06/28/2013; CT ABD/PELVIS W CM dated 06/28/2013  FINDINGS: Partial visualization of the lumbar spine shows findings compatible with discitis/osteomyelitis at L4-L5. Florid paravertebral phlegmon is present. Epidural edema and enhancement is present. There is liquefaction of the disc on T2 weighted imaging with abscess in the disc space which extends dorsally. There is osteolysis of the right side of the  vertebral bodies of L4 and L5. Diffuse effacement of bone marrow. The S1 vertebra  appears within normal limits. There is no defined epidural abscess. Only phlegmon is seen in the epidural space dorsal to the vertebral bodies. There is abscess tracking out of the L4-L5 disc and extending cranially deep to the anterior longitudinal ligament.  The hip joints appear within normal limits. SI joints appear normal. Pubic symphysis normal. Bilateral common hamstring origins are normal.  The visceral pelvis shows right-greater-than-left iliacus muscle edema. No ascites is present. No discrete abscess is identified in the psoas muscles.  IMPRESSION: 1. L4-L5 discitis/osteomyelitis. Liquefaction of the disc with paravertebral phlegmon but no epidural abscess. Abscess extends anteriorly out of the disc space with cranial tunneling dorsal to the anterior longitudinal ligament. 2. Based on the appearance of L4-L5, this likely represents pyogenic infection rather than atypical mycobacterial infection. 3. The lumbar spine is only partially visualized. From L3 through S1, only the L4-L5 levels appear infected. Full imaging of the lumbar spine could be considered to assess other levels of infection. 4. These results were called by telephone at the time of interpretation on 08/27/2013 at 9:03 AM to Dr. Unice Bailey , who verbally acknowledged these results.   Electronically Signed   By: Dereck Ligas M.D.   On: 08/27/2013 09:05    Assessment/Plan Back pain. MRI findings consistent with lumbar 4-5 discitis/osteomyelitis Leukocytosis Request for image guided Lumbar level 4-5 disc aspiration. Patient has been NPO, no blood thinners, labs reviewed. Risks and Benefits discussed with the patient and his wife. All of the patient's questions were answered, patient is agreeable to proceed. Consent signed and in chart.   Tsosie Billing D PA-C 08/27/2013, 2:05 PM

## 2013-08-27 NOTE — H&P (Signed)
[facility] History and Physical  Wm Sahagun PNT:[id number] DOB: 04-30-1966 DOA: 08/27/2013  Referring physician: EDP PCP: Jeb Levering, Philbert Riser, MD   Chief Complaint: back pain   HPI: Mitchell Sparks is a 48 y.o. male with h/o hyperlipdemia who presents with c/o worsening low back and bil hip pain. He also admits to intermittent subjective fevers. Pt denies any trauma, back surgeries, IV drug abuse or any infections. He reports he had some dental work/tooth extraction done a year ago and did well with that. His PCP had referrred him to rheumatology among other subspecialists for this ongoing pain and Dr Anderson(rheum) ordered an MRI which was read today per radiology as L4-L5 discitis/osteomyelitis. Liquefaction of the disc with paravertebral phlegmon but no epidural abscess. Abscess extends anteriorly out of the disc space with cranial tunneling dorsal to the anterior longitudinal ligament. Per EDP pt was dicussed with Dr Cyndy Freeze and he states no surgical intervention indicated. IR was consulted for disc aspiration and admission to [facility] requested. He denies any TB exposure or sick contacts.  Pt also denies weakness, parasthesias and no bladder or urinary incontinence.     Review of Systems The patient denies anorexia, weight loss,, vision loss, decreased hearing, hoarseness, chest pain, syncope, dyspnea on exertion, peripheral edema, balance deficits, hemoptysis, abdominal pain, melena, hematochezia, severe indigestion/heartburn, hematuria, incontinence, genital sores, muscle weakness, suspicious skin lesions, transient blindness, difficulty walking, depression, unusual weight change, abnormal bleeding.   Past Medical History  Diagnosis Date  . Hyperlipemia    Past Surgical History  Procedure Laterality Date  . Denies     Social History:  reports that he has quit smoking. He does not have any smokeless tobacco history on file. He reports that he drinks alcohol. He reports that he  does not use illicit drugs.  No Known Allergies  Family History  Problem Relation Age of Onset  . Diabetes    . Hyperlipidemia    . Hypertension    . Stroke       Prior to Admission medications   Medication Sig Start Date End Date Taking? Authorizing Provider  acetaminophen (TYLENOL) 500 MG tablet Take 1,000 mg by mouth every 8 (eight) hours as needed.   Yes Historical Provider, MD  ibuprofen (ADVIL,MOTRIN) 800 MG tablet Take 800 mg by mouth 3 (three) times daily.   Yes Historical Provider, MD   Physical Exam: Filed Vitals:   08/27/13 1621  BP: 124/81  Pulse: 105  Temp: 99.2 F (37.3 C)  Resp: 18    BP 124/81  Pulse 105  Temp(Src) 99.2 F (37.3 C) (Oral)  Resp 18  Ht 5\' 6"  (1.676 m)  Wt 73.347 kg (161 lb 11.2 oz)  BMI 26.11 kg/m2  SpO2 98% Constitutional: Vital signs reviewed.  Patient is a well-developed and well-nourished  in no acute distress and cooperative with exam. Alert and oriented x3.  Head: Normocephalic and atraumatic Mouth: no erythema or exudates, MMM Eyes: PERRL, EOMI, conjunctivae normal, No scleral icterus.  Neck: Supple, Trachea midline normal ROM, No JVD, mass, thyromegaly, or carotid bruit present.  Cardiovascular: RRR, S1 normal, S2 normal, no MRG, pulses symmetric and intact bilaterally Pulmonary/Chest: normal respiratory effort, CTAB, no wheezes, rales, or rhonchi Abdominal: Soft. Non-tender, non-distended, bowel sounds are normal, no masses, organomegaly, or guarding present. Back: Nontender, ROM limited by pain  GU: no CVA tenderness Extremities:no cyanosis and no edema Neurological: A&O x3, Strength is normal and symmetric bilaterally, cranial nerve II-XII are grossly intact, no focal motor deficit,  sensory intact to light touch bilaterally.  Skin: Warm, dry and intact. No rash, cyanosis, or clubbing.  Psychiatric: Normal mood and affect. speech and behavior is normal. Judgment and thought content normal. Cognition and memory are normal.                 Labs on Admission:  Basic Metabolic Panel:  Recent Labs Lab 08/27/13 1030  NA 139  K 4.3  CL 100  CO2 26  GLUCOSE 82  BUN 12  CREATININE 0.75  CALCIUM 9.7   Liver Function Tests: No results found for this basename: AST, ALT, ALKPHOS, BILITOT, PROT, ALBUMIN,  in the last 168 hours No results found for this basename: LIPASE, AMYLASE,  in the last 168 hours No results found for this basename: AMMONIA,  in the last 168 hours CBC:  Recent Labs Lab 08/27/13 1030  WBC 12.1*  NEUTROABS 8.4*  HGB 12.1*  HCT 37.6*  MCV 81.4  PLT 423*   Cardiac Enzymes: No results found for this basename: CKTOTAL, CKMB, CKMBINDEX, TROPONINI,  in the last 168 hours  BNP (last 3 results) No results found for this basename: PROBNP,  in the last 8760 hours CBG: No results found for this basename: GLUCAP,  in the last 168 hours  Radiological Exams on Admission: Dg Chest 2 View  08/27/2013   CLINICAL DATA:  Leukocytosis  EXAM: CHEST  2 VIEW  COMPARISON:  None.  FINDINGS: Lungs are clear. Heart size and pulmonary vascularity are normal. No adenopathy. No bone lesions.  IMPRESSION: No abnormality noted.   Electronically Signed   By: Lowella Grip M.D.   On: 08/27/2013 13:37   Mr Pelvis W Wo Contrast  08/27/2013   CLINICAL DATA:  Flu like symptoms. Severe pelvic pelvic pain and hip pain.  EXAM: MRI PELVIS WITHOUT AND WITH CONTRAST  TECHNIQUE: Multiplanar multisequence MR imaging of the pelvis was performed both before and after administration of intravenous contrast.  CONTRAST:  57mL MULTIHANCE GADOBENATE DIMEGLUMINE 529 MG/ML IV SOLN  COMPARISON:  DG LUMBAR SPINE COMPLETE dated 06/28/2013; CT ABD/PELVIS W CM dated 06/28/2013  FINDINGS: Partial visualization of the lumbar spine shows findings compatible with discitis/osteomyelitis at L4-L5. Florid paravertebral phlegmon is present. Epidural edema and enhancement is present. There is liquefaction of the disc on T2 weighted imaging with  abscess in the disc space which extends dorsally. There is osteolysis of the right side of the vertebral bodies of L4 and L5. Diffuse effacement of bone marrow. The S1 vertebra appears within normal limits. There is no defined epidural abscess. Only phlegmon is seen in the epidural space dorsal to the vertebral bodies. There is abscess tracking out of the L4-L5 disc and extending cranially deep to the anterior longitudinal ligament.  The hip joints appear within normal limits. SI joints appear normal. Pubic symphysis normal. Bilateral common hamstring origins are normal.  The visceral pelvis shows right-greater-than-left iliacus muscle edema. No ascites is present. No discrete abscess is identified in the psoas muscles.  IMPRESSION: 1. L4-L5 discitis/osteomyelitis. Liquefaction of the disc with paravertebral phlegmon but no epidural abscess. Abscess extends anteriorly out of the disc space with cranial tunneling dorsal to the anterior longitudinal ligament. 2. Based on the appearance of L4-L5, this likely represents pyogenic infection rather than atypical mycobacterial infection. 3. The lumbar spine is only partially visualized. From L3 through S1, only the L4-L5 levels appear infected. Full imaging of the lumbar spine could be considered to assess other levels of infection. 4. These results were  called by telephone at the time of interpretation on 08/27/2013 at 9:03 AM to Dr. Unice Bailey , who verbally acknowledged these results.   Electronically Signed   By: Dereck Ligas M.D.   On: 08/27/2013 09:05      Assessment/Plan Active Problems:   Lumbar/L4-5 discitis/Osteomyelitis -As discussed above, will managed conservatively -pain managemement -blood cultures ordered, follow up on results of disc aspirate  -As above per NS no surgical intervention indicated -I have consulted ID- Dr Tommy Medal to follow up and eval pt- he recommends to hold off antbitics for now( as long as he remains heomdynamically  stable)till cultures results come back in case specimen is nondiagnostic and repeat disc aspiration is warranted.     Code Status: full  Family Communication: wife at bedside Disposition Plan: admit to [facility]  Time spent: >27mins  [facility] Pager [phone]

## 2013-08-27 NOTE — ED Provider Notes (Signed)
CSN: [id number]     Arrival date & time 08/27/13  1000 History   First MD Initiated Contact with Patient 08/27/13 1002     Chief Complaint  Patient presents with  . Back Pain     (Consider location/radiation/quality/duration/timing/severity/associated sxs/prior Treatment) HPI  47yM with back pain. Has been ongoing for months. Atraumatic. Multiple evaluations including ER, family doctor, rheumatology, gastroenterology without clear explanation until recent MRI. Called by Dr Ouida Sills informing that sending pt to ED for vertebral osteomyelitis/disciitis. Pt is healthy aside from hyperlipidemia. No hx of TB. No hx of back/spinal surgery. Denies IV drug use. No hx of endocarditis. Does report dental work ~1 year ago with tooth extraction. Intermittent low grade fevers. No urinary complaints. Has had some abdominal bloating intermittently, but none currently. No numbness, tingling or loss of strength. Apparently Dr Christella Noa, neurosurgery, already contacted?   Past Medical History  Diagnosis Date  . Hyperlipemia    Past Surgical History  Procedure Laterality Date  . Denies     Family History  Problem Relation Age of Onset  . Diabetes    . Hyperlipidemia    . Hypertension    . Stroke     History  Substance Use Topics  . Smoking status: Former Research scientist (life sciences)  . Smokeless tobacco: Not on file  . Alcohol Use: Yes    Review of Systems  All systems reviewed and negative, other than as noted in HPI.   Allergies  Review of patient's allergies indicates no known allergies.  Home Medications   Current Outpatient Rx  Name  Route  Sig  Dispense  Refill  . acetaminophen (TYLENOL) 500 MG tablet   Oral   Take 1,000 mg by mouth every 8 (eight) hours as needed.         Marland Kitchen ibuprofen (ADVIL,MOTRIN) 800 MG tablet   Oral   Take 800 mg by mouth 3 (three) times daily.          BP 123/79  Pulse 93  Temp(Src) 98.3 F (36.8 C) (Oral)  Resp 16  Ht 5\' 6"  (1.676 m)  Wt 160 lb (72.576 kg)  BMI  25.84 kg/m2  SpO2 100% Physical Exam  Nursing note and vitals reviewed. Constitutional: He is oriented to person, place, and time. He appears well-developed and well-nourished. No distress.  Laying in bed. NAD. Appears mildly uncomfortable and significantly more so with movement. During exam changes position slowly/deliberating.   HENT:  Head: Normocephalic and atraumatic.  Eyes: Conjunctivae are normal. Right eye exhibits no discharge. Left eye exhibits no discharge.  Neck: Neck supple.  Cardiovascular: Normal rate, regular rhythm and normal heart sounds.  Exam reveals no gallop and no friction rub.   No murmur heard. Pulmonary/Chest: Effort normal and breath sounds normal. No respiratory distress.  Abdominal: Soft. He exhibits no distension. There is no tenderness.  Musculoskeletal: He exhibits no edema and no tenderness.  Back normal to inspection. Pain not really reproducible with palpation but reports increased with hip flexion.   Neurological: He is alert and oriented to person, place, and time. No cranial nerve deficit. He exhibits normal muscle tone. Coordination normal.  Strength 5/5 b/l LE. Sensation intact to light touch.   Skin: Skin is warm and dry.  Psychiatric: He has a normal mood and affect. His behavior is normal. Thought content normal.    ED Course  Procedures (including critical care time) Labs Review Labs Reviewed  CBC WITH DIFFERENTIAL - Abnormal; Notable for the following:    WBC 12.1 (*)  Hemoglobin 12.1 (*)    HCT 37.6 (*)    Platelets 423 (*)    Neutro Abs 8.4 (*)    All other components within normal limits  SEDIMENTATION RATE - Abnormal; Notable for the following:    Sed Rate 128 (*)    All other components within normal limits  CULTURE, BLOOD (ROUTINE X 2)  CULTURE, BLOOD (ROUTINE X 2)  BASIC METABOLIC PANEL  C-REACTIVE PROTEIN  PROTIME-INR   Imaging Review Mr Pelvis W Wo Contrast  08/27/2013   CLINICAL DATA:  Flu like symptoms. Severe pelvic  pelvic pain and hip pain.  EXAM: MRI PELVIS WITHOUT AND WITH CONTRAST  TECHNIQUE: Multiplanar multisequence MR imaging of the pelvis was performed both before and after administration of intravenous contrast.  CONTRAST:  78mL MULTIHANCE GADOBENATE DIMEGLUMINE 529 MG/ML IV SOLN  COMPARISON:  DG LUMBAR SPINE COMPLETE dated 06/28/2013; CT ABD/PELVIS W CM dated 06/28/2013  FINDINGS: Partial visualization of the lumbar spine shows findings compatible with discitis/osteomyelitis at L4-L5. Florid paravertebral phlegmon is present. Epidural edema and enhancement is present. There is liquefaction of the disc on T2 weighted imaging with abscess in the disc space which extends dorsally. There is osteolysis of the right side of the vertebral bodies of L4 and L5. Diffuse effacement of bone marrow. The S1 vertebra appears within normal limits. There is no defined epidural abscess. Only phlegmon is seen in the epidural space dorsal to the vertebral bodies. There is abscess tracking out of the L4-L5 disc and extending cranially deep to the anterior longitudinal ligament.  The hip joints appear within normal limits. SI joints appear normal. Pubic symphysis normal. Bilateral common hamstring origins are normal.  The visceral pelvis shows right-greater-than-left iliacus muscle edema. No ascites is present. No discrete abscess is identified in the psoas muscles.  IMPRESSION: 1. L4-L5 discitis/osteomyelitis. Liquefaction of the disc with paravertebral phlegmon but no epidural abscess. Abscess extends anteriorly out of the disc space with cranial tunneling dorsal to the anterior longitudinal ligament. 2. Based on the appearance of L4-L5, this likely represents pyogenic infection rather than atypical mycobacterial infection. 3. The lumbar spine is only partially visualized. From L3 through S1, only the L4-L5 levels appear infected. Full imaging of the lumbar spine could be considered to assess other levels of infection. 4. These results were  called by telephone at the time of interpretation on 08/27/2013 at 9:03 AM to Dr. Unice Bailey , who verbally acknowledged these results.   Electronically Signed   By: Dereck Ligas M.D.   On: 08/27/2013 09:05     EKG Interpretation None      MDM   Final diagnoses:  Vertebral osteomyelitis    47yM with lower back pain. MRI as above. Blood cultures, inflammatory markers, basic labs, pain meds. Will defer abx at this time until can get culture. No obvious precipitant. Possibly hematogenous spread after dental procedure? Discussed with Dr Christella Noa, neurosurgery. No neurosurgical involvement necessary at this time as pt is neurologically intact and presuming can obtain cultures percutaneously. Discussed with IR scheduling. Will evaluate pt, but may may have to schedule for tomorrow as pts are typically sedated for the procedure. Discussed with medicine for admission.     Virgel Manifold, MD 08/27/13 [phone]

## 2013-08-28 DIAGNOSIS — A4901 Methicillin susceptible Staphylococcus aureus infection, unspecified site: Secondary | ICD-10-CM

## 2013-08-28 DIAGNOSIS — G4700 Insomnia, unspecified: Secondary | ICD-10-CM

## 2013-08-28 DIAGNOSIS — M519 Unspecified thoracic, thoracolumbar and lumbosacral intervertebral disc disorder: Secondary | ICD-10-CM

## 2013-08-28 DIAGNOSIS — Z113 Encounter for screening for infections with a predominantly sexual mode of transmission: Secondary | ICD-10-CM

## 2013-08-28 DIAGNOSIS — M869 Osteomyelitis, unspecified: Secondary | ICD-10-CM

## 2013-08-28 LAB — CBC
HCT: 35.6 % — ABNORMAL LOW (ref 39.0–52.0)
Hemoglobin: 11.3 g/dL — ABNORMAL LOW (ref 13.0–17.0)
MCH: 25.6 pg — AB (ref 26.0–34.0)
MCHC: 31.7 g/dL (ref 30.0–36.0)
MCV: 80.7 fL (ref 78.0–100.0)
PLATELETS: 426 10*3/uL — AB (ref 150–400)
RBC: 4.41 MIL/uL (ref 4.22–5.81)
RDW: 14.5 % (ref 11.5–15.5)
WBC: 12.1 10*3/uL — AB (ref 4.0–10.5)

## 2013-08-28 LAB — BASIC METABOLIC PANEL
BUN: 10 mg/dL (ref 6–23)
CALCIUM: 8.7 mg/dL (ref 8.4–10.5)
CO2: 25 mEq/L (ref 19–32)
Chloride: 95 mEq/L — ABNORMAL LOW (ref 96–112)
Creatinine, Ser: 0.79 mg/dL (ref 0.50–1.35)
GFR calc non Af Amer: >90 mL/min (ref >90)
Glucose, Bld: 101 mg/dL — ABNORMAL HIGH (ref 70–99)
Potassium: 3.9 mEq/L (ref 3.7–5.3)
SODIUM: 133 meq/L — AB (ref 137–147)

## 2013-08-28 LAB — SEDIMENTATION RATE: Sed Rate: 102 mm/hr — ABNORMAL HIGH (ref 0–16)

## 2013-08-28 LAB — HEPATITIS C ANTIBODY (REFLEX): HCV Ab: NEGATIVE

## 2013-08-28 LAB — C-REACTIVE PROTEIN: CRP: 7.4 mg/dL — ABNORMAL HIGH (ref <0.60)

## 2013-08-28 MED ORDER — VANCOMYCIN HCL IN DEXTROSE 1-5 GM/200ML-% IV SOLN
1000.0000 mg | Freq: Three times a day (TID) | INTRAVENOUS | Status: DC
  Administered 2013-08-28 – 2013-08-30 (×5): 1000 mg via INTRAVENOUS
  Filled 2013-08-28 (×7): qty 200

## 2013-08-28 MED ORDER — VANCOMYCIN HCL 10 G IV SOLR
1500.0000 mg | Freq: Once | INTRAVENOUS | Status: AC
  Administered 2013-08-28: 1500 mg via INTRAVENOUS
  Filled 2013-08-28 (×2): qty 1500

## 2013-08-28 MED ORDER — CEFAZOLIN SODIUM-DEXTROSE 2-3 GM-% IV SOLR
2.0000 g | Freq: Three times a day (TID) | INTRAVENOUS | Status: DC
  Administered 2013-08-28 – 2013-08-31 (×10): 2 g via INTRAVENOUS
  Filled 2013-08-28 (×13): qty 50

## 2013-08-28 NOTE — Progress Notes (Signed)
PROGRESS NOTE  Mitchell Sparks WVP:[id number] DOB: 1965-07-07 DOA: 08/27/2013 PCP: Simona Huh, MD  Assessment/Plan:  Vertebral Osteomyelitis / Diskitis L4 / L5 IR has completed aspiration of the L4 / L5 area and fluid was sent for culture Preliminary results show gm + cocci in pairs, but the culture has been reincubated. Blood cultures are pending. NS was called (Dr. Christella Noa).  No intervention required at this point.  Conservative therapy recommended. Dr. Tommy Medal has been consulted and will recommend appropriate antibiotic therapy as the cultures become more definitive The patient is hemodynamically stable, but having low grade fever and pain which is being treated supportively.  Hyperlipidemia Not currently on any medications.   DVT Prophylaxis:  lovenox  Code Status: full Family Communication:  Disposition Plan: to home when appropriate.  Will likely need long term antibiotic therapy.   Consultants:  Infectious Disease  IR  Procedures:  Fluro guided aspiration of the L4/L5 disk space.  Antibiotics: Anti-infectives   None        HPI/Subjective: Complains of severe pain when trying to get out of bed.  States he has been having low grade fever for months - taking ibuprofen and tylenol for it at home.  Objective: Filed Vitals:   08/27/13 1621 08/27/13 2102 08/27/13 2300 08/28/13 0526  BP: 124/81 106/80  103/62  Pulse: 105 112  111  Temp: 99.2 F (37.3 C) 99.9 F (37.7 C) 98 F (36.7 C) 100.2 F (37.9 C)  TempSrc: Oral Oral Oral Oral  Resp: 18 16  18   Height: 5\' 6"  (1.676 m)     Weight: 73.347 kg (161 lb 11.2 oz)     SpO2: 98% 93%  94%    Intake/Output Summary (Last 24 hours) at 08/28/13 1154 Last data filed at 08/28/13 1100  Gross per 24 hour  Intake 1616.67 ml  Output      0 ml  Net 1616.67 ml   Filed Weights   08/27/13 1013 08/27/13 1621  Weight: 72.576 kg (160 lb) 73.347 kg (161 lb 11.2 oz)    Exam: General: Well developed, well  nourished, NAD, appears stated age  48:  PERR, EOMI, Anicteic Sclera, MMM. No pharyngeal erythema or exudates  Neck: Supple, no JVD, no masses  Cardiovascular: RRR, S1 S2 auscultated, no rubs, murmurs or gallops.   Respiratory: Clear to auscultation bilaterally with equal chest rise  Abdomen: Soft, nontender, nondistended, + bowel sounds  Extremities: warm dry without cyanosis clubbing or edema.  Neuro: AAOx3, cranial nerves grossly intact. Strength 5/5 in upper and lower extremities  Skin: Without rashes exudates or nodules.   Psych: Normal affect and demeanor with intact judgement and insight       Data Reviewed: Basic Metabolic Panel:  Recent Labs Lab 08/27/13 1030 08/28/13 0435  NA 139 133*  K 4.3 3.9  CL 100 95*  CO2 26 25  GLUCOSE 82 101*  BUN 12 10  CREATININE 0.75 0.79  CALCIUM 9.7 8.7   CBC:  Recent Labs Lab 08/27/13 1030 08/28/13 0435  WBC 12.1* 12.1*  NEUTROABS 8.4*  --   HGB 12.1* 11.3*  HCT 37.6* 35.6*  MCV 81.4 80.7  PLT 423* 426*     Recent Results (from the past 240 hour(s))  CULTURE, BLOOD (ROUTINE X 2)     Status: None   Collection Time    08/27/13 10:03 AM      Result Value Ref Range Status   Specimen Description BLOOD RIGHT ANTECUBITAL   Final  Special Requests BOTTLES DRAWN AEROBIC AND ANAEROBIC 5MLS   Final   Culture  Setup Time     Final   Value: 08/27/2013 14:11     Performed at [facility]   Culture     Final   Value:        BLOOD CULTURE RECEIVED NO GROWTH TO DATE CULTURE WILL BE HELD FOR 5 DAYS BEFORE ISSUING A FINAL NEGATIVE REPORT     Performed at [facility]   Report Status PENDING   Incomplete  CULTURE, BLOOD (ROUTINE X 2)     Status: None   Collection Time    08/27/13 10:49 AM      Result Value Ref Range Status   Specimen Description BLOOD LEFT ANTECUBITAL   Final   Special Requests BOTTLES DRAWN AEROBIC AND ANAEROBIC 10CC   Final   Culture  Setup Time     Final   Value: 08/27/2013 14:11      Performed at [facility]   Culture     Final   Value:        BLOOD CULTURE RECEIVED NO GROWTH TO DATE CULTURE WILL BE HELD FOR 5 DAYS BEFORE ISSUING A FINAL NEGATIVE REPORT     Performed at [facility]   Report Status PENDING   Incomplete  CULTURE, ROUTINE-ABSCESS     Status: None   Collection Time    08/27/13  3:14 PM      Result Value Ref Range Status   Specimen Description ABSCESS BACK   Final   Special Requests DISC SPACE L4 AND L5   Final   Gram Stain     Final   Value: ABUNDANT WBC PRESENT, PREDOMINANTLY PMN     NO SQUAMOUS EPITHELIAL CELLS SEEN     RARE GRAM POSITIVE COCCI     IN PAIRS     Performed at [facility]   Culture     Final   Value: Culture reincubated for better growth     Performed at [facility]   Report Status PENDING   Incomplete     Studies: Dg Chest 2 View  08/27/2013   CLINICAL DATA:  Leukocytosis  EXAM: CHEST  2 VIEW  COMPARISON:  None.  FINDINGS: Lungs are clear. Heart size and pulmonary vascularity are normal. No adenopathy. No bone lesions.  IMPRESSION: No abnormality noted.   Electronically Signed   By: Lowella Grip M.D.   On: 08/27/2013 13:37   Mr Pelvis W Wo Contrast  08/27/2013   CLINICAL DATA:  Flu like symptoms. Severe pelvic pelvic pain and hip pain.  EXAM: MRI PELVIS WITHOUT AND WITH CONTRAST  TECHNIQUE: Multiplanar multisequence MR imaging of the pelvis was performed both before and after administration of intravenous contrast.  CONTRAST:  89mL MULTIHANCE GADOBENATE DIMEGLUMINE 529 MG/ML IV SOLN  COMPARISON:  DG LUMBAR SPINE COMPLETE dated 06/28/2013; CT ABD/PELVIS W CM dated 06/28/2013  FINDINGS: Partial visualization of the lumbar spine shows findings compatible with discitis/osteomyelitis at L4-L5. [place] paravertebral phlegmon is present. Epidural edema and enhancement is present. There is liquefaction of the disc on T2 weighted imaging with abscess in the disc space which extends dorsally. There is osteolysis of  the right side of the vertebral bodies of L4 and L5. Diffuse effacement of bone marrow. The S1 vertebra appears within normal limits. There is no defined epidural abscess. Only phlegmon is seen in the epidural space dorsal to the vertebral bodies. There is abscess tracking out of  the L4-L5 disc and extending cranially deep to the anterior longitudinal ligament.  The hip joints appear within normal limits. SI joints appear normal. Pubic symphysis normal. Bilateral common hamstring origins are normal.  The visceral pelvis shows right-greater-than-left iliacus muscle edema. No ascites is present. No discrete abscess is identified in the psoas muscles.  IMPRESSION: 1. L4-L5 discitis/osteomyelitis. Liquefaction of the disc with paravertebral phlegmon but no epidural abscess. Abscess extends anteriorly out of the disc space with cranial tunneling dorsal to the anterior longitudinal ligament. 2. Based on the appearance of L4-L5, this likely represents pyogenic infection rather than atypical mycobacterial infection. 3. The lumbar spine is only partially visualized. From L3 through S1, only the L4-L5 levels appear infected. Full imaging of the lumbar spine could be considered to assess other levels of infection. 4. These results were called by telephone at the time of interpretation on 08/27/2013 at 9:03 AM to Dr. Unice Bailey , who verbally acknowledged these results.   Electronically Signed   By: Dereck Ligas M.D.   On: 08/27/2013 09:05    Scheduled Meds: . enoxaparin (LOVENOX) injection  40 mg Subcutaneous Q24H  . senna  1 tablet Oral BID   Continuous Infusions: . sodium chloride 100 mL/hr (08/27/13 1850)    Active Problems:   Vertebral osteomyelitis   Discitis of lumbar region   Lumbar discitis    Karen Kitchens  [facility] Pager [phone]. If 7PM-7AM, please contact night-coverage at [email or web address], password [facility] 08/28/2013, 11:54 AM  LOS: 1 day

## 2013-08-28 NOTE — Progress Notes (Signed)
Addendum  Patient seen and examined, chart and data base reviewed.  I agree with the above assessment and plan.  For full details please see [name] PA note.  L4-5 discitis/or cellulitis, disc space aspirate Gram stain showed gram-positive cocci in pairs.  ID consulted, blood cultures pending, started on vancomycin.   Mitchell Hopes, MD [facility] Pager: [phone] 08/28/2013, 4:18 PM

## 2013-08-28 NOTE — Progress Notes (Signed)
ANTIBIOTIC CONSULT NOTE - INITIAL  Pharmacy Consult for vancomycin Indication: osteo  No Known Allergies  Patient Measurements: Height: 5\' 6"  (167.6 cm) Weight: 161 lb 11.2 oz (73.347 kg) IBW/kg (Calculated) : 63.8   Vital Signs: Temp: 100.2 F (37.9 C) (04/03 0526) Temp src: Oral (04/03 0526) BP: 103/62 mmHg (04/03 0526) Pulse Rate: 111 (04/03 0526) Intake/Output from previous day:   Intake/Output from this shift: Total I/O In: 1616.7 [I.V.:1616.7] Out: -   Labs:  Recent Labs  08/27/13 1030 08/28/13 0435  WBC 12.1* 12.1*  HGB 12.1* 11.3*  PLT 423* 426*  CREATININE 0.75 0.79   Estimated Creatinine Clearance: 101.9 ml/min (by C-G formula based on Cr of 0.79). No results found for this basename: VANCOTROUGH, VANCOPEAK, VANCORANDOM, [place], GENTPEAK, GENTRANDOM, TOBRATROUGH, TOBRAPEAK, TOBRARND, AMIKACINPEAK, AMIKACINTROU, AMIKACIN,  in the last 72 hours   Microbiology: Recent Results (from the past 720 hour(s))  CULTURE, BLOOD (ROUTINE X 2)     Status: None   Collection Time    08/27/13 10:03 AM      Result Value Ref Range Status   Specimen Description BLOOD RIGHT ANTECUBITAL   Final   Special Requests BOTTLES DRAWN AEROBIC AND ANAEROBIC 5MLS   Final   Culture  Setup Time     Final   Value: 08/27/2013 14:11     Performed at [facility]   Culture     Final   Value:        BLOOD CULTURE RECEIVED NO GROWTH TO DATE CULTURE WILL BE HELD FOR 5 DAYS BEFORE ISSUING A FINAL NEGATIVE REPORT     Performed at [facility]   Report Status PENDING   Incomplete  CULTURE, BLOOD (ROUTINE X 2)     Status: None   Collection Time    08/27/13 10:49 AM      Result Value Ref Range Status   Specimen Description BLOOD LEFT ANTECUBITAL   Final   Special Requests BOTTLES DRAWN AEROBIC AND ANAEROBIC 10CC   Final   Culture  Setup Time     Final   Value: 08/27/2013 14:11     Performed at [facility]   Culture     Final   Value:        BLOOD CULTURE  RECEIVED NO GROWTH TO DATE CULTURE WILL BE HELD FOR 5 DAYS BEFORE ISSUING A FINAL NEGATIVE REPORT     Performed at [facility]   Report Status PENDING   Incomplete  CULTURE, ROUTINE-ABSCESS     Status: None   Collection Time    08/27/13  3:14 PM      Result Value Ref Range Status   Specimen Description ABSCESS BACK   Final   Special Requests DISC SPACE L4 AND L5   Final   Gram Stain     Final   Value: ABUNDANT WBC PRESENT, PREDOMINANTLY PMN     NO SQUAMOUS EPITHELIAL CELLS SEEN     RARE GRAM POSITIVE COCCI     IN PAIRS     Performed at [facility]   Culture     Final   Value: Culture reincubated for better growth     Performed at [facility]   Report Status PENDING   Incomplete    Medical History: Past Medical History  Diagnosis Date  . Hyperlipemia     Assessment: 41 YOM who has had back pain and fever for several months, now with GPC in pairs growing on back abscess culture. Culture  has been reincubated. Blood culture is currently NGTD. SCr 0.8mg /dL with est CrCl >145mL/min. WBC 12.1, tmax 100.2 (is getting APAP) Also on cefazolin 2g IV q8h until culture speciates.  Goal of Therapy:  Vancomycin trough level 15-20 mcg/ml  Plan:  1. Vancomycin 1500mg  IV x1 as a loading dose, then 1000mg  IV q8h 2. Follow renal function, specation/sensitivities, trough at [facility], ID recommendations  Charlcie Prisco D. Annlee Glandon, PharmD, BCPS Clinical Pharmacist Pager: [phone] 08/28/2013 1:35 PM

## 2013-08-28 NOTE — Progress Notes (Signed)
Utilization review completed. Edu On, RN, BSN. 

## 2013-08-28 NOTE — Consult Note (Signed)
[facility]    Date of Admission:  08/27/2013  Date of Consult:  08/28/2013  Reason for Consult: Vertebral osteomyelitis and diskitis Referring Physician: Dr. Hartford Poli   HPI: Mitchell Sparks is an 48 y.o. male with PMHx of hyperlipidemia. Presents with a four-month history of severe low back pain hip pain. At that time he had had some fevers and been diagnosed with a viral illness possible influenza. He was also given antibiotic although he cannot recall which one. He did not improve his symptoms. He has had an extensive multidisciplinary workup for his symptoms that is involved referrals to gastroenterology in addition to his primary care ultimately rheumatology. Workup was unremarkable except for an elevated sedimentation rate and C-reactive protein. Finally the patient did have an MRI this past week which showed L4 and L5 discitis and osteomyelitis.  It also showed Liquefaction of the disc with paravertebral phlegmon but no epidural abscess. Abscess extends anteriorly out of the disc space with cranial tunneling dorsal to the anterior longitudinal ligament   Fortunately he was not given IV antibiotics or oral antibiotics at all prior to interventional radiology aspirate in the disc space. Gram stain showed gram-positive cocci and the culture from him in early appears to be growing a Staphylococcus aureus species although it is too early to run through the [employer] machine  Does not recall any soft tissue injuries or history of folliculitis or "spider bites. Has no history of injection drug use. He doesn't dental problems previously although dental pathology is not associate with staph aureus infection.    Past Medical History  Diagnosis Date  . Hyperlipemia     Past Surgical History  Procedure Laterality Date  . No past surgeries    ergies:   No Known Allergies   Medications: I have reviewed patients current medications as documented in Epic Anti-infectives     Start     Dose/Rate Route Frequency Ordered Stop   08/28/13 2200  vancomycin (VANCOCIN) IVPB 1000 mg/200 mL premix     1,000 mg 200 mL/hr over 60 Minutes Intravenous Every 8 hours 08/28/13 1336     08/28/13 1400  ceFAZolin (ANCEF) IVPB 2 g/50 mL premix     2 g 100 mL/hr over 30 Minutes Intravenous 3 times per day 08/28/13 1319     08/28/13 1400  vancomycin (VANCOCIN) 1,500 mg in sodium chloride 0.9 % 500 mL IVPB     1,500 mg 250 mL/hr over 120 Minutes Intravenous  Once 08/28/13 1336        Social History:  reports that he has quit smoking. He has never used smokeless tobacco. He reports that he drinks alcohol. He reports that he does not use illicit drugs.  Family History  Problem Relation Age of Onset  . Diabetes    . Hyperlipidemia    . Hypertension    . Stroke      As in HPI and primary teams notes otherwise 12 point review of systems is negative  Blood pressure 112/73, pulse 109, temperature 99.3 F (37.4 C), temperature source Oral, resp. rate 16, height $RemoveBe'5\' 6"'tmrSRvatA$  (1.676 m), weight 161 lb 11.2 oz (73.347 kg), SpO2 96.00%. General: Alert and awake, oriented x3, not in any acute distress. HEENT: anicteric sclera, pupils reactive to light and accommodation, EOMI, oropharynx clear and without exudate CVS regular rate, normal r,  no murmur rubs or gallops Chest: clear to auscultation bilaterally, no wheezing, rales or rhonchi Abdomen: soft nontender, nondistended, normal bowel sounds,  Extremities: no  clubbing or edema noted bilaterally MSK: he has pain in Lower back Skin: no rashes Neuro: nonfocal, strength and sensation intact   Results for orders placed during the hospital encounter of 08/27/13 (from the past 48 hour(s))  CULTURE, BLOOD (ROUTINE X 2)     Status: None   Collection Time    08/27/13 10:03 AM      Result Value Ref Range   Specimen Description BLOOD RIGHT ANTECUBITAL     Special Requests BOTTLES DRAWN AEROBIC AND ANAEROBIC 5MLS     Culture  Setup Time        Value: 08/27/2013 14:11     Performed at [facility]   Culture       Value:        BLOOD CULTURE RECEIVED NO GROWTH TO DATE CULTURE WILL BE HELD FOR 5 DAYS BEFORE ISSUING A FINAL NEGATIVE REPORT     Performed at [facility]   Report Status PENDING    CBC WITH DIFFERENTIAL     Status: Abnormal   Collection Time    08/27/13 10:30 AM      Result Value Ref Range   WBC 12.1 (*) 4.0 - 10.5 K/uL   RBC 4.62  4.22 - 5.81 MIL/uL   Hemoglobin 12.1 (*) 13.0 - 17.0 g/dL   HCT 37.6 (*) 39.0 - 52.0 %   MCV 81.4  78.0 - 100.0 fL   MCH 26.2  26.0 - 34.0 pg   MCHC 32.2  30.0 - 36.0 g/dL   RDW 14.3  11.5 - 15.5 %   Platelets 423 (*) 150 - 400 K/uL   Neutrophils Relative % 69  43 - 77 %   Neutro Abs 8.4 (*) 1.7 - 7.7 K/uL   Lymphocytes Relative 21  12 - 46 %   Lymphs Abs 2.5  0.7 - 4.0 K/uL   Monocytes Relative 9  3 - 12 %   Monocytes Absolute 1.0  0.1 - 1.0 K/uL   Eosinophils Relative 1  0 - 5 %   Eosinophils Absolute 0.1  0.0 - 0.7 K/uL   Basophils Relative 0  0 - 1 %   Basophils Absolute 0.0  0.0 - 0.1 K/uL  BASIC METABOLIC PANEL     Status: None   Collection Time    08/27/13 10:30 AM      Result Value Ref Range   Sodium 139  137 - 147 mEq/L   Potassium 4.3  3.7 - 5.3 mEq/L   Chloride 100  96 - 112 mEq/L   CO2 26  19 - 32 mEq/L   Glucose, Bld 82  70 - 99 mg/dL   BUN 12  6 - 23 mg/dL   Creatinine, Ser 0.75  0.50 - 1.35 mg/dL   Calcium 9.7  8.4 - 10.5 mg/dL   GFR calc non Af Amer >90  >90 mL/min   GFR calc Af Amer >90  >90 mL/min   Comment: (NOTE)     The eGFR has been calculated using the CKD EPI equation.     This calculation has not been validated in all clinical situations.     eGFR's persistently <90 mL/min signify possible Chronic Kidney     Disease.  C-REACTIVE PROTEIN     Status: Abnormal   Collection Time    08/27/13 10:30 AM      Result Value Ref Range   CRP 6.8 (*) <0.60 mg/dL   Comment: Performed at [facility]  RATE     Status:  Abnormal   Collection Time    08/27/13 10:30 AM      Result Value Ref Range   Sed Rate 128 (*) 0 - 16 mm/hr  CULTURE, BLOOD (ROUTINE X 2)     Status: None   Collection Time    08/27/13 10:49 AM      Result Value Ref Range   Specimen Description BLOOD LEFT ANTECUBITAL     Special Requests BOTTLES DRAWN AEROBIC AND ANAEROBIC 10CC     Culture  Setup Time       Value: 08/27/2013 14:11     Performed at [facility]   Culture       Value:        BLOOD CULTURE RECEIVED NO GROWTH TO DATE CULTURE WILL BE HELD FOR 5 DAYS BEFORE ISSUING A FINAL NEGATIVE REPORT     Performed at [facility]   Report Status PENDING    PROTIME-INR     Status: None   Collection Time    08/27/13 12:47 PM      Result Value Ref Range   Prothrombin Time 12.8  11.6 - 15.2 seconds   INR 0.98  0.00 - 1.49  CULTURE, ROUTINE-ABSCESS     Status: None   Collection Time    08/27/13  3:14 PM      Result Value Ref Range   Specimen Description ABSCESS BACK     Special Requests DISC SPACE L4 AND L5     Gram Stain       Value: ABUNDANT WBC PRESENT, PREDOMINANTLY PMN     NO SQUAMOUS EPITHELIAL CELLS SEEN     RARE GRAM POSITIVE COCCI     IN PAIRS     Performed at [facility]   Culture       Value: Culture reincubated for better growth     Performed at [facility]   Report Status PENDING    CBC     Status: Abnormal   Collection Time    08/28/13  4:35 AM      Result Value Ref Range   WBC 12.1 (*) 4.0 - 10.5 K/uL   RBC 4.41  4.22 - 5.81 MIL/uL   Hemoglobin 11.3 (*) 13.0 - 17.0 g/dL   HCT 35.6 (*) 39.0 - 52.0 %   MCV 80.7  78.0 - 100.0 fL   MCH 25.6 (*) 26.0 - 34.0 pg   MCHC 31.7  30.0 - 36.0 g/dL   RDW 14.5  11.5 - 15.5 %   Platelets 426 (*) 150 - 400 K/uL  BASIC METABOLIC PANEL     Status: Abnormal   Collection Time    08/28/13  4:35 AM      Result Value Ref Range   Sodium 133 (*) 137 - 147 mEq/L   Potassium 3.9  3.7 - 5.3 mEq/L   Chloride 95 (*) 96 - 112 mEq/L   CO2 25  19 -  32 mEq/L   Glucose, Bld 101 (*) 70 - 99 mg/dL   BUN 10  6 - 23 mg/dL   Creatinine, Ser 0.79  0.50 - 1.35 mg/dL   Calcium 8.7  8.4 - 10.5 mg/dL   GFR calc non Af Amer >90  >90 mL/min   GFR calc Af Amer >90  >90 mL/min   Comment: (NOTE)     The eGFR has been calculated using the CKD EPI equation.     This calculation has not been  validated in all clinical situations.     eGFR's persistently <90 mL/min signify possible Chronic Kidney     Disease.  HEPATITIS C ANTIBODY (REFLEX)     Status: None   Collection Time    08/28/13  4:35 AM      Result Value Ref Range   HCV Ab NEGATIVE  NEGATIVE   Comment: Performed at [facility]     Status: Abnormal   Collection Time    08/28/13  4:35 AM      Result Value Ref Range   Sed Rate 102 (*) 0 - 16 mm/hr  C-REACTIVE PROTEIN     Status: Abnormal   Collection Time    08/28/13  4:35 AM      Result Value Ref Range   CRP 7.4 (*) <0.60 mg/dL   Comment: Performed at [place] [facility] 08/27/2013 [place] SPACE L4 AND L5 08/27/2013 1514   CULT  Value: Culture reincubated for better growth Performed at [facility] 08/27/2013 1514   REPTSTATUS PENDING 08/27/2013 1514   Dg Chest 2 View  08/27/2013   CLINICAL DATA:  Leukocytosis  EXAM: CHEST  2 VIEW  COMPARISON:  None.  FINDINGS: Lungs are clear. Heart size and pulmonary vascularity are normal. No adenopathy. No bone lesions.  IMPRESSION: No abnormality noted.   Electronically Signed   By: Lowella Grip M.D.   On: 08/27/2013 13:37   Mr Pelvis W Wo Contrast  08/27/2013   CLINICAL DATA:  Flu like symptoms. Severe pelvic pelvic pain and hip pain.  EXAM: MRI PELVIS WITHOUT AND WITH CONTRAST  TECHNIQUE: Multiplanar multisequence MR imaging of the pelvis was performed both before and after administration of intravenous contrast.  CONTRAST:  60mL MULTIHANCE GADOBENATE DIMEGLUMINE 529 MG/ML IV SOLN  COMPARISON:  DG  LUMBAR SPINE COMPLETE dated 06/28/2013; CT ABD/PELVIS W CM dated 06/28/2013  FINDINGS: Partial visualization of the lumbar spine shows findings compatible with discitis/osteomyelitis at L4-L5. [place] paravertebral phlegmon is present. Epidural edema and enhancement is present. There is liquefaction of the disc on T2 weighted imaging with abscess in the disc space which extends dorsally. There is osteolysis of the right side of the vertebral bodies of L4 and L5. Diffuse effacement of bone marrow. The S1 vertebra appears within normal limits. There is no defined epidural abscess. Only phlegmon is seen in the epidural space dorsal to the vertebral bodies. There is abscess tracking out of the L4-L5 disc and extending cranially deep to the anterior longitudinal ligament.  The hip joints appear within normal limits. SI joints appear normal. Pubic symphysis normal. Bilateral common hamstring origins are normal.  The visceral pelvis shows right-greater-than-left iliacus muscle edema. No ascites is present. No discrete abscess is identified in the psoas muscles.  IMPRESSION: 1. L4-L5 discitis/osteomyelitis. Liquefaction of the disc with paravertebral phlegmon but no epidural abscess. Abscess extends anteriorly out of the disc space with cranial tunneling dorsal to the anterior longitudinal ligament. 2. Based on the appearance of L4-L5, this likely represents pyogenic infection rather than atypical mycobacterial infection. 3. The lumbar spine is only partially visualized. From L3 through S1, only the L4-L5 levels appear infected. Full imaging of the lumbar spine could be considered to assess other levels of infection. 4. These results were called by telephone at the time of interpretation on 08/27/2013 at 9:03 AM to Dr. Unice Bailey , who verbally acknowledged these  results.   Electronically Signed   By: Dereck Ligas M.D.   On: 08/27/2013 09:05     Recent Results (from the past 720 hour(s))  CULTURE, BLOOD (ROUTINE X 2)      Status: None   Collection Time    08/27/13 10:03 AM      Result Value Ref Range Status   Specimen Description BLOOD RIGHT ANTECUBITAL   Final   Special Requests BOTTLES DRAWN AEROBIC AND ANAEROBIC 5MLS   Final   Culture  Setup Time     Final   Value: 08/27/2013 14:11     Performed at [facility]   Culture     Final   Value:        BLOOD CULTURE RECEIVED NO GROWTH TO DATE CULTURE WILL BE HELD FOR 5 DAYS BEFORE ISSUING A FINAL NEGATIVE REPORT     Performed at [facility]   Report Status PENDING   Incomplete  CULTURE, BLOOD (ROUTINE X 2)     Status: None   Collection Time    08/27/13 10:49 AM      Result Value Ref Range Status   Specimen Description BLOOD LEFT ANTECUBITAL   Final   Special Requests BOTTLES DRAWN AEROBIC AND ANAEROBIC 10CC   Final   Culture  Setup Time     Final   Value: 08/27/2013 14:11     Performed at [facility]   Culture     Final   Value:        BLOOD CULTURE RECEIVED NO GROWTH TO DATE CULTURE WILL BE HELD FOR 5 DAYS BEFORE ISSUING A FINAL NEGATIVE REPORT     Performed at [facility]   Report Status PENDING   Incomplete  CULTURE, ROUTINE-ABSCESS     Status: None   Collection Time    08/27/13  3:14 PM      Result Value Ref Range Status   Specimen Description ABSCESS BACK   Final   Special Requests DISC SPACE L4 AND L5   Final   Gram Stain     Final   Value: ABUNDANT WBC PRESENT, PREDOMINANTLY PMN     NO SQUAMOUS EPITHELIAL CELLS SEEN     RARE GRAM POSITIVE COCCI     IN PAIRS     Performed at [facility]   Culture     Final   Value: Culture reincubated for better growth     Performed at [facility]   Report Status PENDING   Incomplete     Impression/Recommendation  Active Problems:   Vertebral osteomyelitis   Discitis of lumbar region   Lumbar discitis   Mitchell Sparks is a 48 y.o. male with  L4-L5 diskitis vertebral osteomyelitis with apparent staph Cox aureus.  #1 Staphylococcus aureus of  vertebral discitis osteomyelitis  --Start vancomycin dose for goal trough 15-20 and start cefazolin 2 g IV every 8 hours.  --Narrow above antibiotics as culture data and sensitivity comes back.  --Placed PICC line prior to discharge I ordered for tomorrow to make sure he is not bacteremic prior to [place] placing the line.  --Check baseline sedimentation rate and C-reactive protein this admission  --I. would treat him for at least 6 if not 8 weeks for bleeding 8 weeks with IV antibiotics.  He require weekly CBC metabolic panel and is on vancomycin vancomycin troughs faxed to me at [phone].  I spent greater than 45 minutes with the patient including greater than 50% of time in  face to face counsel of the patient and in coordination of their care.   #2 screening screen for HIV and hep C  #3 recent problems with dental infection approximately a year ago offered to perform Panorex but I think this is of a lower priority given that the organism seems to be a skin bacteria rather than an oral pathogen   08/28/2013, 4:58 PM   Thank you so much for this interesting consult  [place] [facility] [phone] (pager) [phone] (office) 08/28/2013, 4:58 PM  Rhina [place] 08/28/2013, 4:58 PM

## 2013-08-29 DIAGNOSIS — M25559 Pain in unspecified hip: Secondary | ICD-10-CM

## 2013-08-29 DIAGNOSIS — A048 Other specified bacterial intestinal infections: Secondary | ICD-10-CM

## 2013-08-29 LAB — BASIC METABOLIC PANEL
BUN: 12 mg/dL (ref 6–23)
CO2: 25 mEq/L (ref 19–32)
Calcium: 9 mg/dL (ref 8.4–10.5)
Chloride: 96 mEq/L (ref 96–112)
Creatinine, Ser: 0.79 mg/dL (ref 0.50–1.35)
GFR calc Af Amer: >90 mL/min (ref >90)
GLUCOSE: 123 mg/dL — AB (ref 70–99)
Potassium: 4 mEq/L (ref 3.7–5.3)
Sodium: 137 mEq/L (ref 137–147)

## 2013-08-29 LAB — CBC
HEMATOCRIT: 35.4 % — AB (ref 39.0–52.0)
HEMOGLOBIN: 11.2 g/dL — AB (ref 13.0–17.0)
MCH: 25.6 pg — AB (ref 26.0–34.0)
MCHC: 31.6 g/dL (ref 30.0–36.0)
MCV: 81 fL (ref 78.0–100.0)
Platelets: 366 10*3/uL (ref 150–400)
RBC: 4.37 MIL/uL (ref 4.22–5.81)
RDW: 14.4 % (ref 11.5–15.5)
WBC: 13.9 10*3/uL — ABNORMAL HIGH (ref 4.0–10.5)

## 2013-08-29 LAB — HEPATITIS PANEL, ACUTE
HCV Ab: NEGATIVE
HEP A IGM: NONREACTIVE
Hep B C IgM: NONREACTIVE
Hepatitis B Surface Ag: NEGATIVE

## 2013-08-29 LAB — C-REACTIVE PROTEIN: CRP: 8.9 mg/dL — AB (ref <0.60)

## 2013-08-29 LAB — SEDIMENTATION RATE: SED RATE: 95 mm/h — AB (ref 0–16)

## 2013-08-29 MED ORDER — SODIUM CHLORIDE 0.9 % IJ SOLN
10.0000 mL | INTRAMUSCULAR | Status: DC | PRN
  Administered 2013-08-31: 10 mL

## 2013-08-29 NOTE — Progress Notes (Signed)
PROGRESS NOTE  Mitchell Sparks ZDG:[id number] DOB: 12/17/1965 DOA: 08/27/2013 PCP: Simona Huh, MD   HPI/Subjective: Patient's pain improved since yesterday, likely to her use of narcotic pain medication to control the pain.  Assessment/Plan:  Vertebral Osteomyelitis / Diskitis L4-5 IR has completed aspiration of the L4 / L5, culture showed Staphylococcus aureus Preliminary results show gm + cocci in pairs, but the culture has been reincubated. Blood cultures are pending. NS was called (Dr. Christella Noa).  No intervention required at this point.  Conservative therapy recommended. Dr. Tommy Medal has been consulted, patient started on vancomycin and cefazolin. IV please advise if patient needs to rule out endocarditis, blood culture negative.  Hyperlipidemia Not currently on any medications.   DVT Prophylaxis:  lovenox  Code Status: full Family Communication:  Disposition Plan: to home when appropriate.  Will likely need long term antibiotic therapy.   Consultants:  Infectious Disease  IR  Procedures:  Fluro guided aspiration of the L4/L5 disk space.  Antibiotics: Anti-infectives   Start     Dose/Rate Route Frequency Ordered Stop   08/28/13 2200  vancomycin (VANCOCIN) IVPB 1000 mg/200 mL premix     1,000 mg 200 mL/hr over 60 Minutes Intravenous Every 8 hours 08/28/13 1336     08/28/13 1400  ceFAZolin (ANCEF) IVPB 2 g/50 mL premix     2 g 100 mL/hr over 30 Minutes Intravenous 3 times per day 08/28/13 1319     08/28/13 1400  vancomycin (VANCOCIN) 1,500 mg in sodium chloride 0.9 % 500 mL IVPB     1,500 mg 250 mL/hr over 120 Minutes Intravenous  Once 08/28/13 1336 08/28/13 1712         Objective: Filed Vitals:   08/28/13 0526 08/28/13 1452 08/28/13 2258 08/29/13 0500  BP: 103/62 112/73 103/68 95/61  Pulse: 111 109 106 96  Temp: 100.2 F (37.9 C) 99.3 F (37.4 C) 100.5 F (38.1 C) 98.4 F (36.9 C)  TempSrc: Oral Oral Oral Oral  Resp: 18 16 16 16   Height:        Weight:      SpO2: 94% 96% 96% 96%   No intake or output data in the 24 hours ending 08/29/13 1314 Filed Weights   08/27/13 1013 08/27/13 1621  Weight: 72.576 kg (160 lb) 73.347 kg (161 lb 11.2 oz)    Exam: General: Well developed, well nourished, NAD, appears stated age  14:  PERR, EOMI, Anicteic Sclera, MMM. No pharyngeal erythema or exudates  Neck: Supple, no JVD, no masses  Cardiovascular: RRR, S1 S2 auscultated, no rubs, murmurs or gallops.   Respiratory: Clear to auscultation bilaterally with equal chest rise  Abdomen: Soft, nontender, nondistended, + bowel sounds  Extremities: warm dry without cyanosis clubbing or edema.  Neuro: AAOx3, cranial nerves grossly intact. Strength 5/5 in upper and lower extremities  Skin: Without rashes exudates or nodules.   Psych: Normal affect and demeanor with intact judgement and insight   Data Reviewed: Basic Metabolic Panel:  Recent Labs Lab 08/27/13 1030 08/28/13 0435 08/29/13 0615  NA 139 133* 137  K 4.3 3.9 4.0  CL 100 95* 96  CO2 26 25 25   GLUCOSE 82 101* 123*  BUN 12 10 12   CREATININE 0.75 0.79 0.79  CALCIUM 9.7 8.7 9.0   CBC:  Recent Labs Lab 08/27/13 1030 08/28/13 0435 08/29/13 0615  WBC 12.1* 12.1* 13.9*  NEUTROABS 8.4*  --   --   HGB 12.1* 11.3* 11.2*  HCT 37.6* 35.6* 35.4*  MCV  81.4 80.7 81.0  PLT 423* 426* 366     Recent Results (from the past 240 hour(s))  CULTURE, BLOOD (ROUTINE X 2)     Status: None   Collection Time    08/27/13 10:03 AM      Result Value Ref Range Status   Specimen Description BLOOD RIGHT ANTECUBITAL   Final   Special Requests BOTTLES DRAWN AEROBIC AND ANAEROBIC 5MLS   Final   Culture  Setup Time     Final   Value: 08/27/2013 14:11     Performed at [facility]   Culture     Final   Value:        BLOOD CULTURE RECEIVED NO GROWTH TO DATE CULTURE WILL BE HELD FOR 5 DAYS BEFORE ISSUING A FINAL NEGATIVE REPORT     Performed at [facility]   Report Status  PENDING   Incomplete  CULTURE, BLOOD (ROUTINE X 2)     Status: None   Collection Time    08/27/13 10:49 AM      Result Value Ref Range Status   Specimen Description BLOOD LEFT ANTECUBITAL   Final   Special Requests BOTTLES DRAWN AEROBIC AND ANAEROBIC 10CC   Final   Culture  Setup Time     Final   Value: 08/27/2013 14:11     Performed at [facility]   Culture     Final   Value:        BLOOD CULTURE RECEIVED NO GROWTH TO DATE CULTURE WILL BE HELD FOR 5 DAYS BEFORE ISSUING A FINAL NEGATIVE REPORT     Performed at [facility]   Report Status PENDING   Incomplete  CULTURE, ROUTINE-ABSCESS     Status: None   Collection Time    08/27/13  3:14 PM      Result Value Ref Range Status   Specimen Description ABSCESS BACK   Final   Special Requests DISC SPACE L4 AND L5   Final   Gram Stain     Final   Value: ABUNDANT WBC PRESENT, PREDOMINANTLY PMN     NO SQUAMOUS EPITHELIAL CELLS SEEN     RARE GRAM POSITIVE COCCI     IN PAIRS     Performed at [facility]   Culture     Final   Value: MODERATE STAPHYLOCOCCUS AUREUS     Note: RIFAMPIN AND GENTAMICIN SHOULD NOT BE USED AS SINGLE DRUGS FOR TREATMENT OF STAPH INFECTIONS.     Performed at [facility]   Report Status PENDING   Incomplete     Studies: Dg Chest 2 View  08/27/2013   CLINICAL DATA:  Leukocytosis  EXAM: CHEST  2 VIEW  COMPARISON:  None.  FINDINGS: Lungs are clear. Heart size and pulmonary vascularity are normal. No adenopathy. No bone lesions.  IMPRESSION: No abnormality noted.   Electronically Signed   By: Lowella Grip M.D.   On: 08/27/2013 13:37    Scheduled Meds: .  ceFAZolin (ANCEF) IV  2 g Intravenous 3 times per day  . enoxaparin (LOVENOX) injection  40 mg Subcutaneous Q24H  . senna  1 tablet Oral BID  . vancomycin  1,000 mg Intravenous Q8H   Continuous Infusions:    Active Problems:   Vertebral osteomyelitis   Discitis of lumbar region   Lumbar discitis    [facility]  A  [facility] Pager [phone]. If 7PM-7AM, please contact night-coverage at [email or web address], password [facility] 08/29/2013, 1:14 PM  LOS: 2 days

## 2013-08-29 NOTE — Progress Notes (Signed)
Peripherally Inserted Central Catheter/Midline Placement  The IV Nurse has discussed with the patient and/or persons authorized to consent for the patient, the purpose of this procedure and the potential benefits and risks involved with this procedure.  The benefits include less needle sticks, lab draws from the catheter and patient may be discharged home with the catheter.  Risks include, but not limited to, infection, bleeding, blood clot (thrombus formation), and puncture of an artery; nerve damage and irregular heat beat.  Alternatives to this procedure were also discussed.  PICC/Midline Placement Documentation        Mitchell Sparks 08/29/2013, 11:38 AM

## 2013-08-29 NOTE — Progress Notes (Signed)
[place] for Infectious Disease  Day # 2 vancomycin Day #2 ancef   Subjective: C/o pain in thighs and lower back   Antibiotics:  Anti-infectives   Start     Dose/Rate Route Frequency Ordered Stop   08/28/13 2200  vancomycin (VANCOCIN) IVPB 1000 mg/200 mL premix     1,000 mg 200 mL/hr over 60 Minutes Intravenous Every 8 hours 08/28/13 1336     08/28/13 1400  ceFAZolin (ANCEF) IVPB 2 g/50 mL premix     2 g 100 mL/hr over 30 Minutes Intravenous 3 times per day 08/28/13 1319     08/28/13 1400  vancomycin (VANCOCIN) 1,500 mg in sodium chloride 0.9 % 500 mL IVPB     1,500 mg 250 mL/hr over 120 Minutes Intravenous  Once 08/28/13 1336 08/28/13 1712      Medications: Scheduled Meds: .  ceFAZolin (ANCEF) IV  2 g Intravenous 3 times per day  . enoxaparin (LOVENOX) injection  40 mg Subcutaneous Q24H  . senna  1 tablet Oral BID  . vancomycin  1,000 mg Intravenous Q8H   Continuous Infusions:  PRN Meds:.acetaminophen, acetaminophen, bisacodyl, morphine injection, ondansetron (ZOFRAN) IV, ondansetron, oxyCODONE, sodium chloride    Objective: Weight change:  No intake or output data in the 24 hours ending 08/29/13 1519 Blood pressure 113/73, pulse 100, temperature 98.2 F (36.8 C), temperature source Oral, resp. rate 18, height 5\' 6"  (1.676 m), weight 161 lb 11.2 oz (73.347 kg), SpO2 97.00%. Temp:  [98.2 F (36.8 C)-100.5 F (38.1 C)] 98.2 F (36.8 C) (04/04 1449) Pulse Rate:  [96-106] 100 (04/04 1449) Resp:  [16-18] 18 (04/04 1449) BP: (95-113)/(61-73) 113/73 mmHg (04/04 1449) SpO2:  [96 %-97 %] 97 % (04/04 1449)  Physical Exam: General: Alert and awake, oriented x3, not in any acute distress.  HEENT: anicteric sclera, pupils reactive to light and accommodation, EOMI, oropharynx clear and without exudate  CVS regular rate, normal r, no murmur rubs or gallops  Chest: clear to auscultation bilaterally, no wheezing, rales or rhonchi  Abdomen: soft nontender,  nondistended, normal bowel sounds,  Extremities: no clubbing or edema noted bilaterally  MSK: he has pain in Lower back  Skin: no rashes  Neuro: nonfocal, strength and sensation intact   CBC:   Recent Labs  08/28/13 0435 08/29/13 0615  WBC 12.1* 13.9*  HGB 11.3* 11.2*  HCT 35.6* 35.4*  NA 133* 137  K 3.9 4.0  CL 95* 96  CO2 25 25  BUN 10 12  CREATININE 0.79 0.79     BMET  Recent Labs  08/28/13 0435 08/29/13 0615  NA 133* 137  K 3.9 4.0  CL 95* 96  CO2 25 25  GLUCOSE 101* 123*  BUN 10 12  CREATININE 0.79 0.79  CALCIUM 8.7 9.0     Liver Panel  No results found for this basename: PROT, ALBUMIN, AST, ALT, ALKPHOS, BILITOT, BILIDIR, IBILI,  in the last 72 hours     Sedimentation Rate  Recent Labs  08/29/13 0615  ESRSEDRATE 95*   C-Reactive Protein  Recent Labs  08/28/13 0435 08/29/13 0615  CRP 7.4* 8.9*    Micro Results: Recent Results (from the past 240 hour(s))  CULTURE, BLOOD (ROUTINE X 2)     Status: None   Collection Time    08/27/13 10:03 AM      Result Value Ref Range Status   Specimen Description BLOOD RIGHT ANTECUBITAL   Final   Special Requests BOTTLES DRAWN AEROBIC AND ANAEROBIC 5MLS  Final   Culture  Setup Time     Final   Value: 08/27/2013 14:11     Performed at [facility]   Culture     Final   Value:        BLOOD CULTURE RECEIVED NO GROWTH TO DATE CULTURE WILL BE HELD FOR 5 DAYS BEFORE ISSUING A FINAL NEGATIVE REPORT     Performed at [facility]   Report Status PENDING   Incomplete  CULTURE, BLOOD (ROUTINE X 2)     Status: None   Collection Time    08/27/13 10:49 AM      Result Value Ref Range Status   Specimen Description BLOOD LEFT ANTECUBITAL   Final   Special Requests BOTTLES DRAWN AEROBIC AND ANAEROBIC 10CC   Final   Culture  Setup Time     Final   Value: 08/27/2013 14:11     Performed at [facility]   Culture     Final   Value:        BLOOD CULTURE RECEIVED NO GROWTH TO DATE CULTURE  WILL BE HELD FOR 5 DAYS BEFORE ISSUING A FINAL NEGATIVE REPORT     Performed at [facility]   Report Status PENDING   Incomplete  CULTURE, ROUTINE-ABSCESS     Status: None   Collection Time    08/27/13  3:14 PM      Result Value Ref Range Status   Specimen Description ABSCESS BACK   Final   Special Requests DISC SPACE L4 AND L5   Final   Gram Stain     Final   Value: ABUNDANT WBC PRESENT, PREDOMINANTLY PMN     NO SQUAMOUS EPITHELIAL CELLS SEEN     RARE GRAM POSITIVE COCCI     IN PAIRS     Performed at [facility]   Culture     Final   Value: MODERATE STAPHYLOCOCCUS AUREUS     Note: RIFAMPIN AND GENTAMICIN SHOULD NOT BE USED AS SINGLE DRUGS FOR TREATMENT OF STAPH INFECTIONS.     Performed at [facility]   Report Status PENDING   Incomplete    Studies/Results: No results found.    Assessment/Plan:  Active Problems:   Vertebral osteomyelitis   Discitis of lumbar region   Lumbar discitis    Mitchell Sparks is a 48 y.o. male with with L4-L5 diskitis vertebral osteomyelitis with apparent staphylococcus aureus.    #1 Staphylococcus aureus of vertebral discitis osteomyelitis   --continue  vancomycin dose for goal trough 15-20 and start cefazolin 2 g IV every 8 hours pending Sensis   --treat for 8 weeks for bleeding 8 weeks with IV antibiotics.   He require weekly CBC metabolic panel and is on vancomycin vancomycin troughs faxed to me at [phone].     #2 screening screen for HIV and hep panel is negative  #3 recent problems with dental infection approximately a year ago offered to perform Panorex but I think this is of a lower priority given that the organism seems to be a skin bacteria rather than an oral pathogen  #4 thigh pain and hip pain: if persists would consider dedicated hip mRI, his thighs are not tender     LOS: 2 days   Alcide Evener 08/29/2013, 3:19 PM

## 2013-08-30 DIAGNOSIS — M25559 Pain in unspecified hip: Secondary | ICD-10-CM | POA: Diagnosis present

## 2013-08-30 DIAGNOSIS — R21 Rash and other nonspecific skin eruption: Secondary | ICD-10-CM

## 2013-08-30 LAB — HIV-1 RNA QUANT-NO REFLEX-BLD: HIV-1 RNA Quant, Log: <1.3 {Log} (ref <1.30)

## 2013-08-30 LAB — CULTURE, ROUTINE-ABSCESS

## 2013-08-30 MED ORDER — IBUPROFEN 600 MG PO TABS
600.0000 mg | ORAL_TABLET | Freq: Three times a day (TID) | ORAL | Status: DC | PRN
  Filled 2013-08-30: qty 1

## 2013-08-30 NOTE — Progress Notes (Signed)
PROGRESS NOTE  Mitchell Sparks CWU:[id number] DOB: Nov 01, 1965 DOA: 08/27/2013 PCP: Simona Huh, MD   HPI/Subjective: Had temperature of 100.5 last night at 10 PM. Continues to have bilateral hip pain, we'll obtain an MRI.  Assessment/Plan:  Vertebral Osteomyelitis / Diskitis L4-5 IR has completed aspiration of the L4 / L5, culture showed Staphylococcus aureus Preliminary results show gm + cocci in pairs, but the culture has been reincubated. Blood cultures are pending. NS was called (Dr. Christella Noa).  No intervention required at this point.  Conservative therapy recommended. Dr. Tommy Medal has been consulted, patient started on vancomycin and cefazolin. Continues to complain about bilateral hip pain MRI will be obtained  Hyperlipidemia Not currently on any medications.   DVT Prophylaxis:  lovenox  Code Status: full Family Communication:  Disposition Plan: to home when appropriate.  Will likely need long term antibiotic therapy.   Consultants:  Infectious Disease  IR  Procedures:  Fluro guided aspiration of the L4/L5 disk space.  Antibiotics: Anti-infectives   Start     Dose/Rate Route Frequency Ordered Stop   08/28/13 2200  vancomycin (VANCOCIN) IVPB 1000 mg/200 mL premix     1,000 mg 200 mL/hr over 60 Minutes Intravenous Every 8 hours 08/28/13 1336     08/28/13 1400  ceFAZolin (ANCEF) IVPB 2 g/50 mL premix     2 g 100 mL/hr over 30 Minutes Intravenous 3 times per day 08/28/13 1319     08/28/13 1400  vancomycin (VANCOCIN) 1,500 mg in sodium chloride 0.9 % 500 mL IVPB     1,500 mg 250 mL/hr over 120 Minutes Intravenous  Once 08/28/13 1336 08/28/13 1712         Objective: Filed Vitals:   08/29/13 0500 08/29/13 1449 08/29/13 2201 08/30/13 0526  BP: 95/61 113/73 105/71 110/74  Pulse: 96 100 121 87  Temp: 98.4 F (36.9 C) 98.2 F (36.8 C) 100.5 F (38.1 C) 98 F (36.7 C)  TempSrc: Oral Oral Oral Oral  Resp: 16 18 16 15   Height:      Weight:      SpO2:  96% 97% 96% 98%    Intake/Output Summary (Last 24 hours) at 08/30/13 1039 Last data filed at 08/30/13 0517  Gross per 24 hour  Intake    800 ml  Output      0 ml  Net    800 ml   Filed Weights   08/27/13 1013 08/27/13 1621  Weight: 72.576 kg (160 lb) 73.347 kg (161 lb 11.2 oz)    Exam: General: Well developed, well nourished, NAD, appears stated age  43:  PERR, EOMI, Anicteic Sclera, MMM. No pharyngeal erythema or exudates  Neck: Supple, no JVD, no masses  Cardiovascular: RRR, S1 S2 auscultated, no rubs, murmurs or gallops.   Respiratory: Clear to auscultation bilaterally with equal chest rise  Abdomen: Soft, nontender, nondistended, + bowel sounds  Extremities: warm dry without cyanosis clubbing or edema.  Neuro: AAOx3, cranial nerves grossly intact. Strength 5/5 in upper and lower extremities  Skin: Without rashes exudates or nodules.   Psych: Normal affect and demeanor with intact judgement and insight   Data Reviewed: Basic Metabolic Panel:  Recent Labs Lab 08/27/13 1030 08/28/13 0435 08/29/13 0615  NA 139 133* 137  K 4.3 3.9 4.0  CL 100 95* 96  CO2 26 25 25   GLUCOSE 82 101* 123*  BUN 12 10 12   CREATININE 0.75 0.79 0.79  CALCIUM 9.7 8.7 9.0   CBC:  Recent Labs Lab  08/27/13 1030 08/28/13 0435 08/29/13 0615  WBC 12.1* 12.1* 13.9*  NEUTROABS 8.4*  --   --   HGB 12.1* 11.3* 11.2*  HCT 37.6* 35.6* 35.4*  MCV 81.4 80.7 81.0  PLT 423* 426* 366     Recent Results (from the past 240 hour(s))  CULTURE, BLOOD (ROUTINE X 2)     Status: None   Collection Time    08/27/13 10:03 AM      Result Value Ref Range Status   Specimen Description BLOOD RIGHT ANTECUBITAL   Final   Special Requests BOTTLES DRAWN AEROBIC AND ANAEROBIC 5MLS   Final   Culture  Setup Time     Final   Value: 08/27/2013 14:11     Performed at [facility]   Culture     Final   Value:        BLOOD CULTURE RECEIVED NO GROWTH TO DATE CULTURE WILL BE HELD FOR 5 DAYS BEFORE ISSUING  A FINAL NEGATIVE REPORT     Performed at [facility]   Report Status PENDING   Incomplete  CULTURE, BLOOD (ROUTINE X 2)     Status: None   Collection Time    08/27/13 10:49 AM      Result Value Ref Range Status   Specimen Description BLOOD LEFT ANTECUBITAL   Final   Special Requests BOTTLES DRAWN AEROBIC AND ANAEROBIC 10CC   Final   Culture  Setup Time     Final   Value: 08/27/2013 14:11     Performed at [facility]   Culture     Final   Value:        BLOOD CULTURE RECEIVED NO GROWTH TO DATE CULTURE WILL BE HELD FOR 5 DAYS BEFORE ISSUING A FINAL NEGATIVE REPORT     Performed at [facility]   Report Status PENDING   Incomplete  CULTURE, ROUTINE-ABSCESS     Status: None   Collection Time    08/27/13  3:14 PM      Result Value Ref Range Status   Specimen Description ABSCESS BACK   Final   Special Requests DISC SPACE L4 AND L5   Final   Gram Stain     Final   Value: ABUNDANT WBC PRESENT, PREDOMINANTLY PMN     NO SQUAMOUS EPITHELIAL CELLS SEEN     RARE GRAM POSITIVE COCCI     IN PAIRS     Performed at [facility]   Culture     Final   Value: MODERATE STAPHYLOCOCCUS AUREUS     Note: RIFAMPIN AND GENTAMICIN SHOULD NOT BE USED AS SINGLE DRUGS FOR TREATMENT OF STAPH INFECTIONS.     Performed at [facility]   Report Status PENDING   Incomplete     Studies: No results found.  Scheduled Meds: .  ceFAZolin (ANCEF) IV  2 g Intravenous 3 times per day  . enoxaparin (LOVENOX) injection  40 mg Subcutaneous Q24H  . senna  1 tablet Oral BID  . vancomycin  1,000 mg Intravenous Q8H   Continuous Infusions:    Active Problems:   Vertebral osteomyelitis   Discitis of lumbar region   Lumbar discitis    [facility]  [facility] Pager [phone]. If 7PM-7AM, please contact night-coverage at [email or web address], password [facility] 08/30/2013, 10:39 AM  LOS: 3 days

## 2013-08-30 NOTE — Progress Notes (Signed)
[place] for Infectious Disease  Day # 3  vancomycin Day # 3  ancef   Subjective: C/o pain in thighs and lower back,. Co bumps on left forearm   Antibiotics:  Anti-infectives   Start     Dose/Rate Route Frequency Ordered Stop   08/28/13 2200  vancomycin (VANCOCIN) IVPB 1000 mg/200 mL premix  Status:  Discontinued     1,000 mg 200 mL/hr over 60 Minutes Intravenous Every 8 hours 08/28/13 1336 08/30/13 1104   08/28/13 1400  ceFAZolin (ANCEF) IVPB 2 g/50 mL premix     2 g 100 mL/hr over 30 Minutes Intravenous 3 times per day 08/28/13 1319     08/28/13 1400  vancomycin (VANCOCIN) 1,500 mg in sodium chloride 0.9 % 500 mL IVPB     1,500 mg 250 mL/hr over 120 Minutes Intravenous  Once 08/28/13 1336 08/28/13 1712      Medications: Scheduled Meds: .  ceFAZolin (ANCEF) IV  2 g Intravenous 3 times per day  . enoxaparin (LOVENOX) injection  40 mg Subcutaneous Q24H  . senna  1 tablet Oral BID   Continuous Infusions:  PRN Meds:.acetaminophen, acetaminophen, bisacodyl, ibuprofen, morphine injection, ondansetron (ZOFRAN) IV, ondansetron, oxyCODONE, sodium chloride    Objective: Weight change:   Intake/Output Summary (Last 24 hours) at 08/30/13 1347 Last data filed at 08/30/13 0517  Gross per 24 hour  Intake    800 ml  Output      0 ml  Net    800 ml   Blood pressure 110/74, pulse 87, temperature 98 F (36.7 C), temperature source Oral, resp. rate 15, height 5\' 6"  (1.676 m), weight 161 lb 11.2 oz (73.347 kg), SpO2 98.00%. Temp:  [98 F (36.7 C)-100.5 F (38.1 C)] 98 F (36.7 C) (04/05 0526) Pulse Rate:  [87-121] 87 (04/05 0526) Resp:  [15-18] 15 (04/05 0526) BP: (105-113)/(71-74) 110/74 mmHg (04/05 0526) SpO2:  [96 %-98 %] 98 % (04/05 0526)  Physical Exam: General: Alert and awake, oriented x3, not in any acute distress.  HEENT: anicteric sclera, pupils reactive to light and accommodation, EOMI, oropharynx clear and without exudate  CVS regular rate, normal r, no  murmur rubs or gallops  Chest: clear to auscultation bilaterally, no wheezing, rales or rhonchi  Abdomen: soft nontender, nondistended, normal bowel sounds,  Extremities: no clubbing or edema noted bilaterally  MSK: he has pain in Lower back  Skin: see picture raised macules clear on left forearm not erythematous or pruritic    PICC is clean  Neuro: nonfocal, strength and sensation intact   CBC:   Recent Labs  08/28/13 0435 08/29/13 0615  WBC 12.1* 13.9*  HGB 11.3* 11.2*  HCT 35.6* 35.4*  NA 133* 137  K 3.9 4.0  CL 95* 96  CO2 25 25  BUN 10 12  CREATININE 0.79 0.79     BMET  Recent Labs  08/28/13 0435 08/29/13 0615  NA 133* 137  K 3.9 4.0  CL 95* 96  CO2 25 25  GLUCOSE 101* 123*  BUN 10 12  CREATININE 0.79 0.79  CALCIUM 8.7 9.0     Liver Panel  No results found for this basename: PROT, ALBUMIN, AST, ALT, ALKPHOS, BILITOT, BILIDIR, IBILI,  in the last 72 hours     Sedimentation Rate  Recent Labs  08/29/13 0615  ESRSEDRATE 95*   C-Reactive Protein  Recent Labs  08/28/13 0435 08/29/13 0615  CRP 7.4* 8.9*    Micro Results: Recent Results (from the past  240 hour(s))  CULTURE, BLOOD (ROUTINE X 2)     Status: None   Collection Time    08/27/13 10:03 AM      Result Value Ref Range Status   Specimen Description BLOOD RIGHT ANTECUBITAL   Final   Special Requests BOTTLES DRAWN AEROBIC AND ANAEROBIC 5MLS   Final   Culture  Setup Time     Final   Value: 08/27/2013 14:11     Performed at [facility]   Culture     Final   Value:        BLOOD CULTURE RECEIVED NO GROWTH TO DATE CULTURE WILL BE HELD FOR 5 DAYS BEFORE ISSUING A FINAL NEGATIVE REPORT     Performed at [facility]   Report Status PENDING   Incomplete  CULTURE, BLOOD (ROUTINE X 2)     Status: None   Collection Time    08/27/13 10:49 AM      Result Value Ref Range Status   Specimen Description BLOOD LEFT ANTECUBITAL   Final   Special Requests BOTTLES DRAWN AEROBIC  AND ANAEROBIC 10CC   Final   Culture  Setup Time     Final   Value: 08/27/2013 14:11     Performed at [facility]   Culture     Final   Value:        BLOOD CULTURE RECEIVED NO GROWTH TO DATE CULTURE WILL BE HELD FOR 5 DAYS BEFORE ISSUING A FINAL NEGATIVE REPORT     Performed at [facility]   Report Status PENDING   Incomplete  CULTURE, ROUTINE-ABSCESS     Status: None   Collection Time    08/27/13  3:14 PM      Result Value Ref Range Status   Specimen Description ABSCESS BACK   Final   Special Requests DISC SPACE L4 AND L5   Final   Gram Stain     Final   Value: ABUNDANT WBC PRESENT, PREDOMINANTLY PMN     NO SQUAMOUS EPITHELIAL CELLS SEEN     RARE GRAM POSITIVE COCCI     IN PAIRS     Performed at [facility]   Culture     Final   Value: MODERATE STAPHYLOCOCCUS AUREUS     Note: RIFAMPIN AND GENTAMICIN SHOULD NOT BE USED AS SINGLE DRUGS FOR TREATMENT OF STAPH INFECTIONS.     Performed at [facility]   Report Status 08/30/2013 FINAL   Final   Organism ID, Bacteria STAPHYLOCOCCUS AUREUS   Final    Studies/Results: No results found.    Assessment/Plan:  Principal Problem:   Vertebral osteomyelitis Active Problems:   Discitis of lumbar region   Lumbar discitis   Hip pain    Mitchell Sparks is a 48 y.o. male with with L4-L5 diskitis vertebral osteomyelitis with apparent staphylococcus aureus.    #1 Staphylococcus aureus of vertebral discitis osteomyelitis   --continue  cefazolin 2 g IV every 8 hours   --treat for 8 weeks for bleeding 8 weeks with IV antibiotics.   He require weekly CBC metabolic panel and is on vancomycin vancomycin troughs faxed to me at [phone].   He MAY need repeat imaging in the interim to re-assess the evolving phlegmon I spent greater than 25 minutes with the patient including greater than 50% of time in face to face counsel of the patient and in coordination of their care.   #2 screening screen for HIV and  hep panel are negative  #  3 recent problems with dental infection approximately a year ago offered to perform Panorex but I think this is of a lower priority given that the organism seems to be a skin bacteria rather than an oral pathogen  #4 thigh pain and hip pain: I ordered MRI both hips  #5 LEft forearm bumps: hopefully NOT a drug allergy to his beta lactam Will follow       LOS: 3 days   Alcide Evener 08/30/2013, 1:47 PM

## 2013-08-31 DIAGNOSIS — Z0000 Encounter for general adult medical examination without abnormal findings: Secondary | ICD-10-CM

## 2013-08-31 DIAGNOSIS — M79609 Pain in unspecified limb: Secondary | ICD-10-CM

## 2013-08-31 LAB — HIV-1 RNA QUANT-NO REFLEX-BLD: HIV 1 RNA Quant: <20 copies/mL (ref <20)

## 2013-08-31 MED ORDER — HEPARIN SOD (PORK) LOCK FLUSH 100 UNIT/ML IV SOLN
250.0000 [IU] | INTRAVENOUS | Status: AC | PRN
  Administered 2013-08-31: 16:00:00

## 2013-08-31 MED ORDER — OXYCODONE-ACETAMINOPHEN 5-325 MG PO TABS: 1.0000 | ORAL_TABLET | ORAL | Status: DC | PRN

## 2013-08-31 MED ORDER — CEFAZOLIN SODIUM-DEXTROSE 2-3 GM-% IV SOLR: 2.0000 g | Freq: Three times a day (TID) | INTRAVENOUS | Status: DC

## 2013-08-31 NOTE — Discharge Summary (Signed)
Physician Discharge Summary  [name] HWE:[id number] DOB: December 14, 1965 DOA: 08/27/2013  PCP: Simona Huh, MD  Admit date: 08/27/2013 Discharge date: 08/31/2013  Time spent: 40 minutes  Recommendations for Outpatient Follow-up:  1. Followup with primary care physician in one week. 2. Followup with Dr. Tommy Medal in 2-3 weeks. 3. Discharge on cefazolin 2 g every 8 hours for at least 6 weeks from now. Followup with [facility] for stop date.  Discharge Diagnoses:  Principal Problem:   Vertebral osteomyelitis Active Problems:   Discitis of lumbar region   Lumbar discitis   Hip pain   Discharge Condition: Stable  Diet recommendation: Regular  Filed Weights   08/27/13 1013 08/27/13 1621  Weight: 72.576 kg (160 lb) 73.347 kg (161 lb 11.2 oz)    History of present illness:  Mitchell Sparks is a 48 y.o. male with h/o hyperlipdemia who presents with c/o worsening low back and bil hip pain. He also admits to intermittent subjective fevers. Pt denies any trauma, back surgeries, IV drug abuse or any infections. He reports he had some dental work/tooth extraction done a year ago and did well with that. His PCP had referrred him to rheumatology among other subspecialists for this ongoing pain and Dr Anderson(rheum) ordered an MRI which was read today per radiology as L4-L5 discitis/osteomyelitis. Liquefaction of Mitchell disc with paravertebral phlegmon but no epidural abscess. Abscess extends anteriorly out of Mitchell disc space with cranial tunneling dorsal to Mitchell anterior longitudinal ligament. Per EDP pt was dicussed with Dr Cyndy Freeze and he states no surgical intervention indicated. IR was consulted for disc aspiration and admission to [facility] requested. He denies any TB exposure or sick contacts. Pt also denies weakness, parasthesias and no bladder or urinary incontinence.   Hospital Course:   MSSA Vertebral Osteomyelitis / Diskitis L4-5  -IR has completed aspiration of Mitchell L4 / L5, culture grew MSSA.  -Blood  cultures are MG DD -NS was called (Dr. Christella Noa). No intervention required at this point. Conservative therapy recommended.  -Dr. Tommy Medal has been consulted, patient started on vancomycin and cefazolin initially. -After Mitchell susceptibility results antibiotics which to cefazolin 2 g every 8 hours.   Hyperlipidemia  -Not currently on any medications.  Procedures:  Aspiration of L4-5 disc space  Consultations:  None  Discharge Exam: Filed Vitals:   08/31/13 1431  BP: 149/80  Pulse: 108  Temp: 98.8 F (37.1 C)  Resp: 16   General: Alert and awake, oriented x3, not in any acute distress. HEENT: anicteric sclera, pupils reactive to light and accommodation, EOMI CVS: S1-S2 clear, no murmur rubs or gallops Chest: clear to auscultation bilaterally, no wheezing, rales or rhonchi Abdomen: soft nontender, nondistended, normal bowel sounds, no organomegaly Extremities: no cyanosis, clubbing or edema noted bilaterally Neuro: Cranial nerves II-XII intact, no focal neurological deficits  Discharge Instructions You were cared for by a hospitalist during your hospital stay. If you have any questions about your discharge medications or Mitchell care you received while you were in [facility] hospital after you are discharged, you can call Mitchell unit and asked to speak with Mitchell hospitalist on call if Mitchell hospitalist that took care of you is not available. Once you are discharged, your primary care physician will handle any further medical issues. Please note that NO REFILLS for any discharge medications will be authorized once you are discharged, as it is imperative that you return to your primary care physician (or establish a relationship with a primary care physician if you do  not have one) for your aftercare needs so that they can reassess your need for medications and monitor your lab values.     Medication List         acetaminophen 500 MG tablet  Commonly known as:  TYLENOL  Take 1,000 mg by mouth every  8 (eight) hours as needed.     ceFAZolin 2-3 GM-% Solr  Commonly known as:  ANCEF  Inject 50 mLs (2 g total) into Mitchell vein every 8 (eight) hours.     ibuprofen 800 MG tablet  Commonly known as:  ADVIL,MOTRIN  Take 800 mg by mouth 3 (three) times daily.     oxyCODONE-acetaminophen 5-325 MG per tablet  Commonly known as:  ROXICET  Take 1 tablet by mouth every 4 (four) hours as needed.       No Known Allergies     Follow-up Information   Follow up with [facility]. (home IV antibiotics and RN visits)    Contact information:   [address] [place] [address] [phone]        Mitchell results of significant diagnostics from this hospitalization (including imaging, microbiology, ancillary and laboratory) are listed below for reference.    Significant Diagnostic Studies: Dg Chest 2 View  08/27/2013   CLINICAL DATA:  Leukocytosis  EXAM: CHEST  2 VIEW  COMPARISON:  None.  FINDINGS: Lungs are clear. Heart size and pulmonary vascularity are normal. No adenopathy. No bone lesions.  IMPRESSION: No abnormality noted.   Electronically Signed   By: Lowella Grip M.D.   On: 08/27/2013 13:37   Mr Pelvis W Wo Contrast  08/27/2013   CLINICAL DATA:  Flu like symptoms. Severe pelvic pelvic pain and hip pain.  EXAM: MRI PELVIS WITHOUT AND WITH CONTRAST  TECHNIQUE: Multiplanar multisequence MR imaging of Mitchell pelvis was performed both before and after administration of intravenous contrast.  CONTRAST:  47mL MULTIHANCE GADOBENATE DIMEGLUMINE 529 MG/ML IV SOLN  COMPARISON:  DG LUMBAR SPINE COMPLETE dated 06/28/2013; CT ABD/PELVIS W CM dated 06/28/2013  FINDINGS: Partial visualization of Mitchell lumbar spine shows findings compatible with discitis/osteomyelitis at L4-L5. [place] paravertebral phlegmon is present. Epidural edema and enhancement is present. There is liquefaction of Mitchell disc on T2 weighted imaging with abscess in Mitchell disc space which extends dorsally. There is osteolysis of  Mitchell right side of Mitchell vertebral bodies of L4 and L5. Diffuse effacement of bone marrow. Mitchell S1 vertebra appears within normal limits. There is no defined epidural abscess. Only phlegmon is seen in Mitchell epidural space dorsal to Mitchell vertebral bodies. There is abscess tracking out of Mitchell L4-L5 disc and extending cranially deep to Mitchell anterior longitudinal ligament.  Mitchell hip joints appear within normal limits. SI joints appear normal. Pubic symphysis normal. Bilateral common hamstring origins are normal.  Mitchell visceral pelvis shows right-greater-than-left iliacus muscle edema. No ascites is present. No discrete abscess is identified in Mitchell psoas muscles.  IMPRESSION: 1. L4-L5 discitis/osteomyelitis. Liquefaction of Mitchell disc with paravertebral phlegmon but no epidural abscess. Abscess extends anteriorly out of Mitchell disc space with cranial tunneling dorsal to Mitchell anterior longitudinal ligament. 2. Based on Mitchell appearance of L4-L5, this likely represents pyogenic infection rather than atypical mycobacterial infection. 3. Mitchell lumbar spine is only partially visualized. From L3 through S1, only Mitchell L4-L5 levels appear infected. Full imaging of Mitchell lumbar spine could be considered to assess other levels of infection. 4. These results were called by telephone at Mitchell time of interpretation on  08/27/2013 at 9:03 AM to Dr. Unice Bailey , who verbally acknowledged these results.   Electronically Signed   By: Dereck Ligas M.D.   On: 08/27/2013 09:05    Microbiology: Recent Results (from Mitchell past 240 hour(s))  CULTURE, BLOOD (ROUTINE X 2)     Status: None   Collection Time    08/27/13 10:03 AM      Result Value Ref Range Status   Specimen Description BLOOD RIGHT ANTECUBITAL   Final   Special Requests BOTTLES DRAWN AEROBIC AND ANAEROBIC 5MLS   Final   Culture  Setup Time     Final   Value: 08/27/2013 14:11     Performed at [facility]   Culture     Final   Value:        BLOOD CULTURE RECEIVED NO GROWTH TO  DATE CULTURE WILL BE HELD FOR 5 DAYS BEFORE ISSUING A FINAL NEGATIVE REPORT     Performed at [facility]   Report Status PENDING   Incomplete  CULTURE, BLOOD (ROUTINE X 2)     Status: None   Collection Time    08/27/13 10:49 AM      Result Value Ref Range Status   Specimen Description BLOOD LEFT ANTECUBITAL   Final   Special Requests BOTTLES DRAWN AEROBIC AND ANAEROBIC 10CC   Final   Culture  Setup Time     Final   Value: 08/27/2013 14:11     Performed at [facility]   Culture     Final   Value:        BLOOD CULTURE RECEIVED NO GROWTH TO DATE CULTURE WILL BE HELD FOR 5 DAYS BEFORE ISSUING A FINAL NEGATIVE REPORT     Performed at [facility]   Report Status PENDING   Incomplete  CULTURE, ROUTINE-ABSCESS     Status: None   Collection Time    08/27/13  3:14 PM      Result Value Ref Range Status   Specimen Description ABSCESS BACK   Final   Special Requests DISC SPACE L4 AND L5   Final   Gram Stain     Final   Value: ABUNDANT WBC PRESENT, PREDOMINANTLY PMN     NO SQUAMOUS EPITHELIAL CELLS SEEN     RARE GRAM POSITIVE COCCI     IN PAIRS     Performed at [facility]   Culture     Final   Value: MODERATE STAPHYLOCOCCUS AUREUS     Note: RIFAMPIN AND GENTAMICIN SHOULD NOT BE USED AS SINGLE DRUGS FOR TREATMENT OF STAPH INFECTIONS.     Performed at [facility]   Report Status 08/30/2013 FINAL   Final   Organism ID, Bacteria STAPHYLOCOCCUS AUREUS   Final     Labs: Basic Metabolic Panel:  Recent Labs Lab 08/27/13 1030 08/28/13 0435 08/29/13 0615  NA 139 133* 137  K 4.3 3.9 4.0  CL 100 95* 96  CO2 26 25 25   GLUCOSE 82 101* 123*  BUN 12 10 12   CREATININE 0.75 0.79 0.79  CALCIUM 9.7 8.7 9.0   Liver Function Tests: No results found for this basename: AST, ALT, ALKPHOS, BILITOT, PROT, ALBUMIN,  in Mitchell last 168 hours No results found for this basename: LIPASE, AMYLASE,  in Mitchell last 168 hours No results found for this basename: AMMONIA,   in Mitchell last 168 hours CBC:  Recent Labs Lab 08/27/13 1030 08/28/13 0435 08/29/13 0615  WBC 12.1* 12.1* 13.9*  NEUTROABS 8.4*  --   --   HGB 12.1* 11.3* 11.2*  HCT 37.6* 35.6* 35.4*  MCV 81.4 80.7 81.0  PLT 423* 426* 366   Cardiac Enzymes: No results found for this basename: CKTOTAL, CKMB, CKMBINDEX, TROPONINI,  in Mitchell last 168 hours BNP: BNP (last 3 results) No results found for this basename: PROBNP,  in Mitchell last 8760 hours CBG: No results found for this basename: GLUCAP,  in Mitchell last 168 hours     Signed:  Maida Widger A  [facility] 08/31/2013, 4:06 PM

## 2013-08-31 NOTE — Progress Notes (Signed)
[facility]  Patient Status:   New Pt this admission to [facility] is providing the following services:   HHRN, home infusion pharmacy for home IV ABX.  Hospital Infusion Coordinator will provide in hospital pre discharge teaching regarding home IV ABX to support smooth DC transition and independence at home with IV ABX.  If patient discharges after hours, please call [phone].   Larry Sierras 08/31/2013, 1:36 PM

## 2013-08-31 NOTE — Progress Notes (Signed)
[facility] for Infectious [facility]   Day # 4  ancef   Subjective: C/o pain in thighs and lower back,. Bumps on arm have gone down   Antibiotics:  Anti-infectives   Start     Dose/Rate Route Frequency Ordered Stop   08/31/13 0000  ceFAZolin (ANCEF) 2-3 GM-% SOLR     2 g 100 mL/hr over 30 Minutes Intravenous Every 8 hours 08/31/13 1604     08/28/13 2200  vancomycin (VANCOCIN) IVPB 1000 mg/200 mL premix  Status:  Discontinued     1,000 mg 200 mL/hr over 60 Minutes Intravenous Every 8 hours 08/28/13 1336 08/30/13 1104   08/28/13 1400  ceFAZolin (ANCEF) IVPB 2 g/50 mL premix     2 g 100 mL/hr over 30 Minutes Intravenous 3 times per day 08/28/13 1319     08/28/13 1400  vancomycin (VANCOCIN) 1,500 mg in sodium chloride 0.9 % 500 mL IVPB     1,500 mg 250 mL/hr over 120 Minutes Intravenous  Once 08/28/13 1336 08/28/13 1712      Medications: Scheduled Meds: .  ceFAZolin (ANCEF) IV  2 g Intravenous 3 times per day  . enoxaparin (LOVENOX) injection  40 mg Subcutaneous Q24H  . senna  1 tablet Oral BID   Continuous Infusions:  PRN Meds:.acetaminophen, acetaminophen, bisacodyl, ibuprofen, morphine injection, ondansetron (ZOFRAN) IV, ondansetron, oxyCODONE, sodium chloride    Objective: Weight change:  No intake or output data in the 24 hours ending 08/31/13 1917 Blood pressure 149/80, pulse 108, temperature 98.8 F (37.1 C), temperature source Oral, resp. rate 16, height 5\' 6"  (1.676 m), weight 161 lb 11.2 oz (73.347 kg), SpO2 96.00%. Temp:  [98.3 F (36.8 C)-99.6 F (37.6 C)] 98.8 F (37.1 C) (04/06 1431) Pulse Rate:  [95-116] 108 (04/06 1431) Resp:  [16] 16 (04/06 1431) BP: (102-149)/(68-80) 149/80 mmHg (04/06 1431) SpO2:  [96 %-97 %] 96 % (04/06 1431)  Physical Exam: General: Alert and awake, oriented x3, not in any acute distress.  HEENT: anicteric sclera, pupils reactive to light and accommodation, EOMI, oropharynx clear and without exudate  CVS regular rate,  normal r, no murmur rubs or gallops  Chest: clear to auscultation bilaterally, no wheezing, rales or rhonchi  Abdomen: soft nontender, nondistended, normal bowel sounds,  Extremities: no clubbing or edema noted bilaterally  MSK: he has pain in Lower back  Skin: bumps on arms are NOT erythematous and are underwhelming at present. They are unilateral  Neuro: nonfocal, strength and sensation intact   CBC:   Recent Labs  08/29/13 0615  WBC 13.9*  HGB 11.2*  HCT 35.4*  NA 137  K 4.0  CL 96  CO2 25  BUN 12  CREATININE 0.79     BMET  Recent Labs  08/29/13 0615  NA 137  K 4.0  CL 96  CO2 25  GLUCOSE 123*  BUN 12  CREATININE 0.79  CALCIUM 9.0     Liver Panel  No results found for this basename: PROT, ALBUMIN, AST, ALT, ALKPHOS, BILITOT, BILIDIR, IBILI,  in the last 72 hours     Sedimentation Rate  Recent Labs  08/29/13 0615  ESRSEDRATE 95*   C-Reactive Protein  Recent Labs  08/29/13 0615  CRP 8.9*    Micro Results: Recent Results (from the past 240 hour(s))  CULTURE, BLOOD (ROUTINE X 2)     Status: None   Collection Time    08/27/13 10:03 AM      Result Value Ref Range Status  Specimen Description BLOOD RIGHT ANTECUBITAL   Final   Special Requests BOTTLES DRAWN AEROBIC AND ANAEROBIC 5MLS   Final   Culture  Setup Time     Final   Value: 08/27/2013 14:11     Performed at [facility]   Culture     Final   Value:        BLOOD CULTURE RECEIVED NO GROWTH TO DATE CULTURE WILL BE HELD FOR 5 DAYS BEFORE ISSUING A FINAL NEGATIVE REPORT     Performed at [facility]   Report Status PENDING   Incomplete  CULTURE, BLOOD (ROUTINE X 2)     Status: None   Collection Time    08/27/13 10:49 AM      Result Value Ref Range Status   Specimen Description BLOOD LEFT ANTECUBITAL   Final   Special Requests BOTTLES DRAWN AEROBIC AND ANAEROBIC 10CC   Final   Culture  Setup Time     Final   Value: 08/27/2013 14:11     Performed at [facility]   Culture     Final   Value:        BLOOD CULTURE RECEIVED NO GROWTH TO DATE CULTURE WILL BE HELD FOR 5 DAYS BEFORE ISSUING A FINAL NEGATIVE REPORT     Performed at [facility]   Report Status PENDING   Incomplete  CULTURE, ROUTINE-ABSCESS     Status: None   Collection Time    08/27/13  3:14 PM      Result Value Ref Range Status   Specimen Description ABSCESS BACK   Final   Special Requests DISC SPACE L4 AND L5   Final   Gram Stain     Final   Value: ABUNDANT WBC PRESENT, PREDOMINANTLY PMN     NO SQUAMOUS EPITHELIAL CELLS SEEN     RARE GRAM POSITIVE COCCI     IN PAIRS     Performed at [facility]   Culture     Final   Value: MODERATE STAPHYLOCOCCUS AUREUS     Note: RIFAMPIN AND GENTAMICIN SHOULD NOT BE USED AS SINGLE DRUGS FOR TREATMENT OF STAPH INFECTIONS.     Performed at [facility]   Report Status 08/30/2013 FINAL   Final   Organism ID, Bacteria STAPHYLOCOCCUS AUREUS   Final    Studies/Results: No results found.    Assessment/Plan:  Principal Problem:   Vertebral osteomyelitis Active Problems:   Discitis of lumbar region   Lumbar discitis   Hip pain    Mitchell Sparks is a 48 y.o. male with with L4-L5 diskitis vertebral osteomyelitis with apparent staphylococcus aureus.    #1 Staphylococcus aureus of vertebral discitis osteomyelitis   --continue  cefazolin 2 g IV every 8 hours   --treat for 8 weeks for bleeding 8 weeks with IV antibiotics.   He require weekly CBC metabolic panel and is on vancomycin vancomycin troughs faxed to me at [phone].   He MAY need repeat imaging in the interim to re-assess the evolving phlegmon   I spent greater than 25 minutes with the patient including greater than 50% of time in face to face counsel of the patient and in coordination of their care.   #2 thigh pain and hip pain: his recent MRI was of pelvis and showed ZERO problems with either hip so no-t high yield to repeat this test---I  cancelled it  #5 LEft forearm bumps:I dont think this is drug rash at all  LOS: 4 days   Alcide Evener 08/31/2013, 7:17 PM

## 2013-08-31 NOTE — Progress Notes (Signed)
08/31/13 Spoke with patient about home IV antibiotics. Patient states that his wife who is a CNA will be able to learn to give IV antibiotics and he is also willing to learn. He chose [facility]. Contacted De[facility] with [facility] and set up IV antibiotics and HHRN with anticipated d/c today. Fuller Plan RN, BSN, CCM

## 2013-08-31 NOTE — Progress Notes (Signed)
Mitchell Sparks to be D/C'd Home per MD order.  Discussed with the patient and all questions fully answered.    Medication List         acetaminophen 500 MG tablet  Commonly known as:  TYLENOL  Take 1,000 mg by mouth every 8 (eight) hours as needed.     ceFAZolin 2-3 GM-% Solr  Commonly known as:  ANCEF  Inject 50 mLs (2 g total) into the vein every 8 (eight) hours.     ibuprofen 800 MG tablet  Commonly known as:  ADVIL,MOTRIN  Take 800 mg by mouth 3 (three) times daily.     oxyCODONE-acetaminophen 5-325 MG per tablet  Commonly known as:  ROXICET  Take 1 tablet by mouth every 4 (four) hours as needed.        VVS, Skin clean, dry and intact without evidence of skin break down, no evidence of skin tears noted. PICC line in place, saline locked by IV team  An After Visit Summary was printed and given to the patient. Patient escorted via [place], and D/C home via private auto.  Elias Else D 08/31/2013 4:44 PM

## 2013-09-02 LAB — CULTURE, BLOOD (ROUTINE X 2)
Culture: NO GROWTH
Culture: NO GROWTH

## 2013-09-23 ENCOUNTER — Ambulatory Visit (INDEPENDENT_AMBULATORY_CARE_PROVIDER_SITE_OTHER): Payer: BC Managed Care – PPO | Admitting: Internal Medicine

## 2013-09-23 ENCOUNTER — Encounter: Payer: Self-pay | Admitting: Internal Medicine

## 2013-09-23 ENCOUNTER — Telehealth: Payer: Self-pay | Admitting: *Deleted

## 2013-09-23 VITALS — BP 125/85 | HR 97 | Temp 98.4°F | Ht 66.0 in | Wt 160.0 lb

## 2013-09-23 DIAGNOSIS — M869 Osteomyelitis, unspecified: Secondary | ICD-10-CM

## 2013-09-23 DIAGNOSIS — M4620 Osteomyelitis of vertebra, site unspecified: Secondary | ICD-10-CM

## 2013-09-23 NOTE — Progress Notes (Signed)
Subjective:    Patient ID: Mitchell Sparks, male    DOB: [birth year], 48 y.o.   MRN: [id number]  HPI Mitchell Sparks is an 48 y.o. Male who had a four-month history of severe low back pain hip pain. At that time he had had some fevers and been diagnosed with a viral illness possible influenza but later had an MRI which showed L4 and L5 discitis and osteomyelitis including liquefaction of the disc with paravertebral phlegmon but no epidural abscess. He underwent biopsy and grew MSSA.  He was placed on cefazolin and now is about 3 1/2 weeks into his therapy.  He is feeling better with less pain and in fact did not need a pain pill yesterday.  No fever, no chills. Pleased with his progress.  Some hip pain but is overall able to walk better.  Initial ESR 102, CRP 7.4.      Review of Systems  Constitutional: Negative for fever, chills and fatigue.  Gastrointestinal: Negative for nausea and diarrhea.  Musculoskeletal: Negative for gait problem.       Much improved pain  Skin: Negative for rash.  Hematological: Negative for adenopathy.       Objective:   Physical Exam  Constitutional: He appears well-developed and well-nourished. No distress.  Eyes: No scleral icterus.  Musculoskeletal:  picc with mild irritation from tape  Skin: Skin is warm and dry. No rash noted.          Assessment & Plan:

## 2013-09-23 NOTE — Telephone Encounter (Signed)
Called pharmacy, spoke with Coretta to give verbal order for ESR, CRP to be drawn in two weeks.  Patient has labs drawn Mondays. Landis Gandy, RN

## 2013-09-23 NOTE — Assessment & Plan Note (Signed)
Doing well.  Continue through about June 1st.  Will check esr and crp with labs in about 2 weeks.  I offered PT since he is having some hip pain but declined at this time.    RTC about 4 weeks.

## 2013-10-05 ENCOUNTER — Encounter: Payer: Self-pay | Admitting: Infectious Disease

## 2013-10-14 ENCOUNTER — Encounter: Payer: Self-pay | Admitting: Infectious Disease

## 2013-10-14 ENCOUNTER — Telehealth: Payer: Self-pay

## 2013-10-14 ENCOUNTER — Ambulatory Visit (INDEPENDENT_AMBULATORY_CARE_PROVIDER_SITE_OTHER): Payer: BC Managed Care – PPO | Admitting: Infectious Disease

## 2013-10-14 ENCOUNTER — Telehealth: Payer: Self-pay | Admitting: *Deleted

## 2013-10-14 VITALS — BP 124/90 | HR 94 | Temp 98.1°F | Ht 66.0 in | Wt 160.0 lb

## 2013-10-14 DIAGNOSIS — L259 Unspecified contact dermatitis, unspecified cause: Secondary | ICD-10-CM

## 2013-10-14 DIAGNOSIS — M4640 Discitis, unspecified, site unspecified: Secondary | ICD-10-CM | POA: Insufficient documentation

## 2013-10-14 DIAGNOSIS — M4646 Discitis, unspecified, lumbar region: Secondary | ICD-10-CM

## 2013-10-14 DIAGNOSIS — M519 Unspecified thoracic, thoracolumbar and lumbosacral intervertebral disc disorder: Secondary | ICD-10-CM

## 2013-10-14 DIAGNOSIS — A4901 Methicillin susceptible Staphylococcus aureus infection, unspecified site: Secondary | ICD-10-CM

## 2013-10-14 DIAGNOSIS — M869 Osteomyelitis, unspecified: Secondary | ICD-10-CM

## 2013-10-14 DIAGNOSIS — G062 Extradural and subdural abscess, unspecified: Secondary | ICD-10-CM

## 2013-10-14 DIAGNOSIS — M4620 Osteomyelitis of vertebra, site unspecified: Secondary | ICD-10-CM

## 2013-10-14 NOTE — Telephone Encounter (Signed)
Patient states his PICC site  has increasing become more irritating.   He has drainage from PICC site.  Nurse changed dressing on Monday but symptoms have worsened.  Advised patient to come to office for physician to take a look at it.   Laverle Patter, RN

## 2013-10-14 NOTE — Progress Notes (Signed)
Subjective:    Patient ID: Mitchell Sparks, male    DOB: Jul 13, 1965, 48 y.o.   MRN: [id number]  HPI  48 y.o. 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  He feels much improved compared to several weeks ago as an inpatient world. His back pain is improved dramatically and is no longer on prescription narcotics.  He does have new pain that comes on his back and radiates down his right leg. This is worse especially after he drives for a while or gets up in the morning.  I reviewed his MRI from an inpatient stay in addition the following:  1. L4-L5 discitis/osteomyelitis. Liquefaction of the disc with  paravertebral phlegmon but no epidural abscess. Abscess extends  anteriorly out of the disc space with cranial tunneling dorsal to  the anterior longitudinal ligament.  2. Based on the appearance of L4-L5, this likely represents pyogenic  infection rather than atypical mycobacterial infection.  3. The lumbar spine is only partially visualized. From L3 through  S1, only the L4-L5 levels appear infected. Full imaging of the  lumbar spine could be considered to assess other levels of  infection.  He came in to clinic today to see our nurse because he had had erythema at his PICC line dressing site that has been present for last 10 days. Dressing has been changed him an occlusive dressing to transparent 1 but he still has persistent erythema here. He at least the fourth patient I seen this month who has had such a reaction with particular infusion company he is getting antibiotics from.       Review of Systems  Constitutional: Negative for fever, chills, diaphoresis, activity change, appetite change, fatigue and unexpected weight change.  HENT: Negative for congestion, rhinorrhea, sinus pressure, sneezing, sore throat and trouble swallowing.   Eyes: Negative for photophobia and visual disturbance.  Respiratory: Negative for  cough, chest tightness, shortness of breath, wheezing and stridor.   Cardiovascular: Negative for chest pain, palpitations and leg swelling.  Gastrointestinal: Negative for nausea, vomiting, abdominal pain, diarrhea, constipation, blood in stool, abdominal distention and anal bleeding.  Genitourinary: Negative for dysuria, hematuria, flank pain and difficulty urinating.  Musculoskeletal: Positive for arthralgias and back pain. Negative for gait problem, joint swelling and myalgias.  Skin: Positive for rash. Negative for color change, pallor and wound.  Neurological: Negative for dizziness, tremors, weakness and light-headedness.  Hematological: Negative for adenopathy. Does not bruise/bleed easily.  Psychiatric/Behavioral: Negative for behavioral problems, confusion, sleep disturbance, dysphoric mood, decreased concentration and agitation.       Objective:   Physical Exam  Nursing note and vitals reviewed. Constitutional: He is oriented to person, place, and time. He appears well-developed and well-nourished. No distress.  HENT:  Head: Normocephalic and atraumatic.  Eyes: Conjunctivae and EOM are normal.  Neck: Normal range of motion. Neck supple. No JVD present.  Cardiovascular: Normal rate, regular rhythm and normal heart sounds.   Pulmonary/Chest: Effort normal. No respiratory distress. He has no wheezes.  Abdominal: He exhibits no distension.  Musculoskeletal: He exhibits no edema and no tenderness.  Neurological: He is alert and oriented to person, place, and time. He exhibits normal muscle tone. Coordination normal.  Skin: Skin is warm and dry. He is not diaphoretic. There is erythema. No pallor.  Psychiatric: He has a normal mood and affect. His behavior is normal. Judgment and thought content normal.    PICC line with erythema at  dressing             Assessment & Plan:   #1 Diskitis, vertebral osteomyelitis with methicillin sensitive staph aureus and  phlegmon:  --Repeat MRI to ensure phlegmon has not developed into an actual abscess that may need to be drained.  --Continue course of antibiotic therapy intravenously, just opposite the we have had a repeat MRI that is reassuring.  I spent greater than 25 minutes with the patient including greater than 50% of time in face to face counsel of the patient and in coordination of their care.   #2 skin reaction to dressing: This is the third of not fourth patient I seen this month with such a similar reaction to dressing of the PICC the same infusion company. I recommend infusion company investigate what trouble agents they're using further dressing changes and perhaps they have changed companies her suppliers or dressings recently because I have not seen this many reactions and has a short amount of time  Pt to see Dr. Linus Salmons next week.

## 2013-10-14 NOTE — Telephone Encounter (Signed)
Patient notified of appt for MRI at [place] [address] on 10/22/13 at 5:15 pm. Authorization # [id number] Myrtis Hopping

## 2013-10-16 ENCOUNTER — Encounter: Payer: Self-pay | Admitting: Infectious Disease

## 2013-10-22 ENCOUNTER — Ambulatory Visit: Payer: BC Managed Care – PPO | Admitting: Internal Medicine

## 2013-10-22 ENCOUNTER — Ambulatory Visit
Admission: RE | Admit: 2013-10-22 | Discharge: 2013-10-22 | Disposition: A | Discharge status: Home / Self Care | Payer: BC Managed Care – PPO | Source: Ambulatory Visit | Attending: Infectious Disease | Admitting: Infectious Disease

## 2013-10-22 DIAGNOSIS — M4646 Discitis, unspecified, lumbar region: Secondary | ICD-10-CM

## 2013-10-22 MED ORDER — GADOBENATE DIMEGLUMINE 529 MG/ML IV SOLN
15.0000 mL | Freq: Once | INTRAVENOUS | Status: AC | PRN
  Administered 2013-10-22: 15 mL via INTRAVENOUS

## 2013-10-26 ENCOUNTER — Ambulatory Visit (INDEPENDENT_AMBULATORY_CARE_PROVIDER_SITE_OTHER): Payer: BC Managed Care – PPO | Admitting: Infectious Disease

## 2013-10-26 ENCOUNTER — Encounter: Payer: Self-pay | Admitting: Infectious Disease

## 2013-10-26 VITALS — BP 126/80 | HR 89 | Temp 98.2°F | Wt 163.0 lb

## 2013-10-26 DIAGNOSIS — M869 Osteomyelitis, unspecified: Secondary | ICD-10-CM

## 2013-10-26 DIAGNOSIS — A4901 Methicillin susceptible Staphylococcus aureus infection, unspecified site: Secondary | ICD-10-CM

## 2013-10-26 DIAGNOSIS — T85698A Other mechanical complication of other specified internal prosthetic devices, implants and grafts, initial encounter: Secondary | ICD-10-CM

## 2013-10-26 DIAGNOSIS — G062 Extradural and subdural abscess, unspecified: Secondary | ICD-10-CM

## 2013-10-26 DIAGNOSIS — M4620 Osteomyelitis of vertebra, site unspecified: Secondary | ICD-10-CM

## 2013-10-26 DIAGNOSIS — M4640 Discitis, unspecified, site unspecified: Secondary | ICD-10-CM

## 2013-10-26 DIAGNOSIS — R21 Rash and other nonspecific skin eruption: Secondary | ICD-10-CM

## 2013-10-26 DIAGNOSIS — M519 Unspecified thoracic, thoracolumbar and lumbosacral intervertebral disc disorder: Secondary | ICD-10-CM

## 2013-10-26 DIAGNOSIS — T82898A Other specified complication of vascular prosthetic devices, implants and grafts, initial encounter: Secondary | ICD-10-CM

## 2013-10-26 MED ORDER — CEPHALEXIN 500 MG PO CAPS: 1000.0000 mg | ORAL_CAPSULE | Freq: Two times a day (BID) | ORAL | Status: DC

## 2013-10-26 NOTE — Progress Notes (Signed)
Subjective:    Patient ID: Mitchell Sparks, male    DOB: May 29, 1965, 48 y.o.   MRN: [id number]  HPI   48 y.o. 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  He felt much improved compared to several weeks ago as an inpatient world. His back pain is improved dramatically and is no longer on prescription narcotics.  He did have new pain that comes on his back and radiates down his right leg. This is worse especially after he drives for a while or gets up in the morning.  I reviewed his MRI from an inpatient stay in addition the following:  1. L4-L5 discitis/osteomyelitis. Liquefaction of the disc with  paravertebral phlegmon but no epidural abscess. Abscess extends  anteriorly out of the disc space with cranial tunneling dorsal to  the anterior longitudinal ligament.  2. Based on the appearance of L4-L5, this likely represents pyogenic  infection rather than atypical mycobacterial infection.  3. The lumbar spine is only partially visualized. From L3 through  S1, only the L4-L5 levels appear infected. Full imaging of the  lumbar spine could be considered to assess other levels of  infection.  he had had erythema at his PICC line dressing site that has been present for last 10 days to prior clinic visit.  And then change the dressing and there has been some improvement in his erythema is persisted. He also obtained a repeat MRI which showed:  Discitis and osteomyelitis at L4-5 with progression bony  destruction. Overall improvement in the amount of inflammation and  enhancement in the bone and surrounding soft tissues. 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 Paraspinous soft tissue  enhancement and psoas enhancement has improved in the interval.       Review of Systems  Constitutional: Negative for fever, chills, diaphoresis, activity change, appetite change, fatigue and unexpected weight change.    HENT: Negative for congestion, rhinorrhea, sinus pressure, sneezing, sore throat and trouble swallowing.   Eyes: Negative for photophobia and visual disturbance.  Respiratory: Negative for cough, chest tightness, shortness of breath, wheezing and stridor.   Cardiovascular: Negative for chest pain, palpitations and leg swelling.  Gastrointestinal: Negative for nausea, vomiting, abdominal pain, diarrhea, constipation, blood in stool, abdominal distention and anal bleeding.  Genitourinary: Negative for dysuria, hematuria, flank pain and difficulty urinating.  Musculoskeletal: Positive for arthralgias and back pain. Negative for gait problem, joint swelling and myalgias.  Skin: Positive for rash. Negative for color change, pallor and wound.  Neurological: Negative for dizziness, tremors, weakness and light-headedness.  Hematological: Negative for adenopathy. Does not bruise/bleed easily.  Psychiatric/Behavioral: Negative for behavioral problems, confusion, sleep disturbance, dysphoric mood, decreased concentration and agitation.       Objective:   Physical Exam  Nursing note and vitals reviewed. Constitutional: He is oriented to person, place, and time. He appears well-developed and well-nourished. No distress.  HENT:  Head: Normocephalic and atraumatic.  Eyes: Conjunctivae and EOM are normal.  Neck: Normal range of motion. Neck supple. No JVD present.  Cardiovascular: Normal rate, regular rhythm and normal heart sounds.   Pulmonary/Chest: Effort normal. No respiratory distress. He has no wheezes.  Abdominal: He exhibits no distension.  Musculoskeletal: He exhibits no edema and no tenderness.  Neurological: He is alert and oriented to person, place, and time. He exhibits normal muscle tone. Coordination normal.  Skin: Skin is warm and dry. He is not diaphoretic. There is erythema.  No pallor.  Psychiatric: He has a normal mood and affect. His behavior is normal. Judgment and thought content  normal.    PICC line at last visit:       PICC line with erythema at dressing [id number]              Assessment & Plan:   #1 Diskitis, vertebral osteomyelitis with methicillin sensitive staph aureus and phlegmon:  - change to po keflex 1 g bid and repeat MRI in next 2 months prior to stopping antibiotics  I spent greater than 25 minutes with the patient including greater than 50% of time in face to face counsel of the patient and in coordination of their care.   #2 skin reaction to dressing: Nurse removed PICC line

## 2013-10-27 NOTE — Addendum Note (Signed)
Addended by: Orland Mustard K on: 10/27/2013 11:02 AM   Modules accepted: Orders

## 2013-10-27 NOTE — Progress Notes (Signed)
Per Dr Tommy Medal  39 cm  Single lumen Peripherally Inserted Central Catheter  removed from right basilic . No sutures present. Dressing was soaked with clear drainage with skin irritation present.  . Area cleansed with chlorhexidine and petroleum dressing applied.  Bacitracin ointment applied to irritated areas. Dressing wrapped with tube guaze to prevent further exposure to adhesive. Pt advised no heavy lifting with this arm, leave dressing for 24 hours and call the office if dressing becomes soaked with blood or sharp pain presents.  Pt tolerated procedure well.    Laverle Patter, RN

## 2013-11-09 ENCOUNTER — Encounter: Payer: Self-pay | Admitting: Infectious Disease

## 2013-11-13 ENCOUNTER — Encounter: Payer: Self-pay | Admitting: Infectious Disease

## 2013-12-18 ENCOUNTER — Encounter: Payer: Self-pay | Admitting: Infectious Disease

## 2013-12-29 ENCOUNTER — Ambulatory Visit
Admission: RE | Admit: 2013-12-29 | Discharge: 2013-12-29 | Disposition: A | Discharge status: Home / Self Care | Payer: BC Managed Care – PPO | Source: Ambulatory Visit | Attending: Infectious Disease | Admitting: Infectious Disease

## 2013-12-29 DIAGNOSIS — G062 Extradural and subdural abscess, unspecified: Secondary | ICD-10-CM

## 2013-12-29 MED ORDER — GADOBENATE DIMEGLUMINE 529 MG/ML IV SOLN
15.0000 mL | Freq: Once | INTRAVENOUS | Status: AC | PRN
  Administered 2013-12-29: 15 mL via INTRAVENOUS

## 2013-12-30 ENCOUNTER — Ambulatory Visit (INDEPENDENT_AMBULATORY_CARE_PROVIDER_SITE_OTHER): Payer: BC Managed Care – PPO | Admitting: Infectious Disease

## 2013-12-30 ENCOUNTER — Encounter: Payer: Self-pay | Admitting: Infectious Disease

## 2013-12-30 VITALS — BP 116/84 | HR 85 | Temp 98.3°F | Wt 168.5 lb

## 2013-12-30 DIAGNOSIS — R21 Rash and other nonspecific skin eruption: Secondary | ICD-10-CM | POA: Diagnosis not present

## 2013-12-30 DIAGNOSIS — M869 Osteomyelitis, unspecified: Secondary | ICD-10-CM

## 2013-12-30 DIAGNOSIS — M4620 Osteomyelitis of vertebra, site unspecified: Secondary | ICD-10-CM

## 2013-12-30 DIAGNOSIS — A4901 Methicillin susceptible Staphylococcus aureus infection, unspecified site: Secondary | ICD-10-CM | POA: Diagnosis not present

## 2013-12-30 LAB — CBC WITH DIFFERENTIAL/PLATELET
BASOS PCT: 1 % (ref 0–1)
Basophils Absolute: 0.1 10*3/uL (ref 0.0–0.1)
EOS ABS: 0.3 10*3/uL (ref 0.0–0.7)
Eosinophils Relative: 5 % (ref 0–5)
HCT: 46 % (ref 39.0–52.0)
Hemoglobin: 15.8 g/dL (ref 13.0–17.0)
Lymphocytes Relative: 38 % (ref 12–46)
Lymphs Abs: 2.1 10*3/uL (ref 0.7–4.0)
MCH: 27.1 pg (ref 26.0–34.0)
MCHC: 34.3 g/dL (ref 30.0–36.0)
MCV: 79 fL (ref 78.0–100.0)
MONO ABS: 0.4 10*3/uL (ref 0.1–1.0)
Monocytes Relative: 8 % (ref 3–12)
NEUTROS PCT: 48 % (ref 43–77)
Neutro Abs: 2.7 10*3/uL (ref 1.7–7.7)
Platelets: 287 10*3/uL (ref 150–400)
RBC: 5.82 MIL/uL — ABNORMAL HIGH (ref 4.22–5.81)
RDW: 16.1 % — ABNORMAL HIGH (ref 11.5–15.5)
WBC: 5.6 10*3/uL (ref 4.0–10.5)

## 2013-12-30 LAB — BASIC METABOLIC PANEL WITH GFR
BUN: 13 mg/dL (ref 6–23)
CO2: 29 mEq/L (ref 19–32)
CREATININE: 0.9 mg/dL (ref 0.50–1.35)
Calcium: 9.5 mg/dL (ref 8.4–10.5)
Chloride: 100 mEq/L (ref 96–112)
Glucose, Bld: 98 mg/dL (ref 70–99)
Potassium: 4.1 mEq/L (ref 3.5–5.3)
Sodium: 138 mEq/L (ref 135–145)

## 2013-12-30 LAB — SEDIMENTATION RATE: Sed Rate: 1 mm/hr (ref 0–16)

## 2013-12-30 LAB — C-REACTIVE PROTEIN

## 2013-12-30 NOTE — Progress Notes (Signed)
Subjective:    Patient ID: Mitchell Sparks, male    DOB: [birth year], 48 y.o.   MRN: [id number]  HPI   48 y.o. 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  He felt much improved compared to several weeks ago as an inpatient world. His back pain is improved dramatically and is no longer on prescription narcotics.  He did have new pain that comes on his back and radiates down his right leg in his back.  I reviewed his MRI from an inpatient stay in addition the following:  1. L4-L5 discitis/osteomyelitis. Liquefaction of the disc with  paravertebral phlegmon but no epidural abscess. Abscess extends  anteriorly out of the disc space with cranial tunneling dorsal to  the anterior longitudinal ligament.  2. Based on the appearance of L4-L5, this likely represents pyogenic  infection rather than atypical mycobacterial infection.  3. The lumbar spine is only partially visualized. From L3 through  S1, only the L4-L5 levels appear infected. Full imaging of the  lumbar spine could be considered to assess other levels of  infection.  he had had erythema at his PICC line dressing site that has been present for last 10 days to prior clinic visit.  And then change the dressing and there has been some improvement in his erythema is persisted. He also obtained a repeat MRI which showed:  Discitis and osteomyelitis at L4-5 with progression bony  destruction. Overall improvement in the amount of inflammation and  enhancement in the bone and surrounding soft tissues. 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 Paraspinous soft tissue  enhancement and psoas enhancement has improved in the interval.  We repeated MRI in May 2015 which showed:   IMPRESSION:  Discitis and osteomyelitis at L4-5 with progression bony  destruction. Overall improvement in the amount of inflammation and  enhancement in the bone and surrounding soft  tissues. 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 Paraspinous soft tissue  enhancement and psoas enhancement has improved in the interval.   We continued him on oral keflex and rechecked MRI which now showed:  IMPRESSION:  1. Continued improvement in the enhancement pattern at L4-5,  compatible with resolving and disc osteomyelitis.  2. Stable collapse of the superior endplate of L5.  3. Near complete resolution of the epidural component.  4. Mild disc bulging at L2-3 and L3-4 without significant stenosis.     Review of Systems  Constitutional: Negative for fever, chills, diaphoresis, activity change, appetite change, fatigue and unexpected weight change.  HENT: Negative for congestion, rhinorrhea, sinus pressure, sneezing, sore throat and trouble swallowing.   Eyes: Negative for photophobia and visual disturbance.  Respiratory: Negative for cough, chest tightness, shortness of breath, wheezing and stridor.   Cardiovascular: Negative for chest pain, palpitations and leg swelling.  Gastrointestinal: Negative for nausea, vomiting, abdominal pain, diarrhea, constipation, blood in stool, abdominal distention and anal bleeding.  Genitourinary: Negative for dysuria, hematuria, flank pain and difficulty urinating.  Musculoskeletal: Positive for arthralgias and back pain. Negative for gait problem, joint swelling and myalgias.  Skin: Negative for color change, pallor and wound.  Neurological: Negative for dizziness, tremors, weakness and light-headedness.  Hematological: Negative for adenopathy. Does not bruise/bleed easily.  Psychiatric/Behavioral: Negative for behavioral problems, confusion, sleep disturbance, dysphoric mood, decreased concentration and agitation.       Objective:   Physical Exam  Nursing note and vitals reviewed. Constitutional: He is  oriented to person, place, and time. He appears well-developed and well-nourished. No distress.  HENT:  Head:  Normocephalic and atraumatic.  Eyes: Conjunctivae and EOM are normal.  Neck: Normal range of motion. Neck supple. No JVD present.  Cardiovascular: Normal rate, regular rhythm and normal heart sounds.   Pulmonary/Chest: Effort normal. No respiratory distress. He has no wheezes.  Abdominal: He exhibits no distension.  Musculoskeletal: He exhibits no edema and no tenderness.  Neurological: He is alert and oriented to person, place, and time. He exhibits normal muscle tone. Coordination normal.  Skin: Skin is warm and dry. He is not diaphoretic. There is erythema. No pallor.  Psychiatric: He has a normal mood and affect. His behavior is normal. Judgment and thought content normal.        Assessment & Plan:   #1 Diskitis, vertebral osteomyelitis with methicillin sensitive staph aureus and phlegmon:  --check ESR and CRP --we will go on the side of over treating and extend his meds for another 2 months    #2 skin reaction to dressing: resolved

## 2014-01-01 ENCOUNTER — Encounter: Payer: Self-pay | Admitting: *Deleted

## 2014-01-01 ENCOUNTER — Telehealth: Payer: Self-pay | Admitting: *Deleted

## 2014-01-01 NOTE — Telephone Encounter (Signed)
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 Advised him I will have to ask Dr Tommy Medal if he will write a letter for him  And give him a call back.  Per Dr Tommy Medal the patient medical conditions prohibites him from doing that type of physical activity and it is ok to write the letter.  Called the patient and advised him I will send the letter today.

## 2014-01-07 ENCOUNTER — Encounter: Payer: Self-pay | Admitting: *Deleted

## 2014-03-01 ENCOUNTER — Ambulatory Visit: Payer: BC Managed Care – PPO | Admitting: Infectious Disease

## 2014-03-10 ENCOUNTER — Ambulatory Visit (INDEPENDENT_AMBULATORY_CARE_PROVIDER_SITE_OTHER): Payer: BC Managed Care – PPO | Admitting: Infectious Disease

## 2014-03-10 ENCOUNTER — Encounter: Payer: Self-pay | Admitting: Internal Medicine

## 2014-03-10 ENCOUNTER — Encounter: Payer: Self-pay | Admitting: Infectious Disease

## 2014-03-10 DIAGNOSIS — B9561 Methicillin susceptible Staphylococcus aureus infection as the cause of diseases classified elsewhere: Secondary | ICD-10-CM

## 2014-03-10 DIAGNOSIS — R7881 Bacteremia: Secondary | ICD-10-CM | POA: Diagnosis not present

## 2014-03-10 DIAGNOSIS — Z23 Encounter for immunization: Secondary | ICD-10-CM | POA: Diagnosis not present

## 2014-03-10 DIAGNOSIS — L259 Unspecified contact dermatitis, unspecified cause: Secondary | ICD-10-CM

## 2014-03-10 DIAGNOSIS — A4901 Methicillin susceptible Staphylococcus aureus infection, unspecified site: Secondary | ICD-10-CM | POA: Diagnosis not present

## 2014-03-10 DIAGNOSIS — M4620 Osteomyelitis of vertebra, site unspecified: Secondary | ICD-10-CM

## 2014-03-10 NOTE — Progress Notes (Signed)
Subjective:    Patient ID: Mitchell Sparks, male    DOB: [birth year], 48 y.o.   MRN: [id number]  HPI   48 y.o. 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  He felt much improved compared to several weeks ago as an inpatient world. His back pain is improved dramatically and is no longer on prescription narcotics.  He did have new pain that comes on his back and radiates down his right leg in his back.  I reviewed his MRI from an inpatient stay in addition the following:  1. L4-L5 discitis/osteomyelitis. Liquefaction of the disc with  paravertebral phlegmon but no epidural abscess. Abscess extends  anteriorly out of the disc space with cranial tunneling dorsal to  the anterior longitudinal ligament.  2. Based on the appearance of L4-L5, this likely represents pyogenic  infection rather than atypical mycobacterial infection.  3. The lumbar spine is only partially visualized. From L3 through  S1, only the L4-L5 levels appear infected. Full imaging of the  lumbar spine could be considered to assess other levels of  infection.  he had had erythema at his PICC line dressing site that has been present for last 10 days to prior clinic visit.  And then change the dressing and there has been some improvement in his erythema is persisted. He also obtained a repeat MRI which showed:  Discitis and osteomyelitis at L4-5 with progression bony  destruction. Overall improvement in the amount of inflammation and  enhancement in the bone and surrounding soft tissues. 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 Paraspinous soft tissue  enhancement and psoas enhancement has improved in the interval.  We repeated MRI in May 2015 which showed:   IMPRESSION:  Discitis and osteomyelitis at L4-5 with progression bony  destruction. Overall improvement in the amount of inflammation and  enhancement in the bone and surrounding soft  tissues. 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 Paraspinous soft tissue  enhancement and psoas enhancement has improved in the interval.   We continued him on oral keflex and rechecked MRI in August which now showed:  IMPRESSION:  1. Continued improvement in the enhancement pattern at L4-5,  compatible with resolving and disc osteomyelitis.  2. Stable collapse of the superior endplate of L5.  3. Near complete resolution of the epidural component.  4. Mild disc bulging at L2-3 and L3-4 without significant stenosis.   His plantar markers were reassuring he continued on oral Keflex and still on this. 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   Review of Systems  Constitutional: Negative for fever, chills, diaphoresis, activity change, appetite change, fatigue and unexpected weight change.  HENT: Negative for congestion, rhinorrhea, sinus pressure, sneezing, sore throat and trouble swallowing.   Eyes: Negative for photophobia and visual disturbance.  Respiratory: Negative for cough, chest tightness, shortness of breath, wheezing and stridor.   Cardiovascular: Negative for chest pain, palpitations and leg swelling.  Gastrointestinal: Negative for nausea, vomiting, abdominal pain, diarrhea, constipation, blood in stool, abdominal distention and anal bleeding.  Genitourinary: Negative for dysuria, hematuria, flank pain and difficulty urinating.  Musculoskeletal: Positive for arthralgias and back pain. Negative for gait problem, joint swelling and myalgias.  Skin: Negative for color change, pallor and wound.  Neurological: Negative for  dizziness, tremors, weakness and light-headedness.  Hematological: Negative for adenopathy. Does not bruise/bleed easily.  Psychiatric/Behavioral: Negative for behavioral problems, confusion, sleep disturbance,  dysphoric mood, decreased concentration and agitation.       Objective:   Physical Exam  Nursing note and vitals reviewed. Constitutional: He is oriented to person, place, and time. He appears well-developed and well-nourished. No distress.  HENT:  Head: Normocephalic and atraumatic.  Eyes: Conjunctivae and EOM are normal.  Neck: Normal range of motion. Neck supple. No JVD present.  Cardiovascular: Normal rate, regular rhythm and normal heart sounds.   Pulmonary/Chest: Effort normal. No respiratory distress. He has no wheezes.  Abdominal: He exhibits no distension.  Musculoskeletal: He exhibits no edema and no tenderness.  Neurological: He is alert and oriented to person, place, and time. He exhibits normal muscle tone. Coordination normal.  Skin: Skin is warm and dry. He is not diaphoretic. There is erythema. No pallor.  Psychiatric: He has a normal mood and affect. His behavior is normal. Judgment and thought content normal.        Assessment & Plan:   #1 Diskitis, vertebral osteomyelitis with methicillin sensitive staph aureus and phlegmon:  --check ESR and CRP --We'll discontinue his antibiotics unless these inflammatory markers or wildly out of whack    #2 skin reaction to dressing: resolved

## 2014-03-11 LAB — SEDIMENTATION RATE: Sed Rate: 1 mm/hr (ref 0–16)

## 2014-03-11 LAB — C-REACTIVE PROTEIN

## 2014-03-12 ENCOUNTER — Telehealth: Payer: Self-pay | Admitting: *Deleted

## 2014-03-12 NOTE — Telephone Encounter (Signed)
Message copied by Landis Gandy on Fri Mar 12, 2014 10:29 AM ------      Message from: VAN DAM, CORNELIUS N      Created: Thu Mar 11, 2014  7:09 PM       CAN WE HAVE PT STOP HIS ORAL ANTIBIOTICS ------

## 2014-03-12 NOTE — Telephone Encounter (Signed)
Relayed information to patient, updated medical list. .Landis Gandy, RN

## 2014-09-13 ENCOUNTER — Ambulatory Visit: Payer: BC Managed Care – PPO | Admitting: Infectious Disease

## 2015-02-11 ENCOUNTER — Encounter (HOSPITAL_BASED_OUTPATIENT_CLINIC_OR_DEPARTMENT_OTHER): Payer: Self-pay | Admitting: *Deleted

## 2015-02-11 ENCOUNTER — Emergency Department (HOSPITAL_BASED_OUTPATIENT_CLINIC_OR_DEPARTMENT_OTHER)
Admission: EM | Admit: 2015-02-11 | Discharge: 2015-02-11 | Disposition: A | Discharge status: Home / Self Care | Payer: BLUE CROSS/BLUE SHIELD | Attending: Emergency Medicine | Admitting: Emergency Medicine

## 2015-02-11 DIAGNOSIS — Z8619 Personal history of other infectious and parasitic diseases: Secondary | ICD-10-CM | POA: Insufficient documentation

## 2015-02-11 DIAGNOSIS — Z8639 Personal history of other endocrine, nutritional and metabolic disease: Secondary | ICD-10-CM | POA: Diagnosis not present

## 2015-02-11 DIAGNOSIS — Z87891 Personal history of nicotine dependence: Secondary | ICD-10-CM | POA: Insufficient documentation

## 2015-02-11 DIAGNOSIS — L03113 Cellulitis of right upper limb: Secondary | ICD-10-CM | POA: Diagnosis present

## 2015-02-11 DIAGNOSIS — Z8739 Personal history of other diseases of the musculoskeletal system and connective tissue: Secondary | ICD-10-CM | POA: Insufficient documentation

## 2015-02-11 MED ORDER — KETOROLAC TROMETHAMINE 30 MG/ML IJ SOLN
30.0000 mg | Freq: Once | INTRAMUSCULAR | Status: AC
  Administered 2015-02-11: 30 mg via INTRAVENOUS
  Filled 2015-02-11: qty 1

## 2015-02-11 MED ORDER — CLINDAMYCIN PHOSPHATE 600 MG/50ML IV SOLN
600.0000 mg | Freq: Once | INTRAVENOUS | Status: AC
  Administered 2015-02-11: 600 mg via INTRAVENOUS

## 2015-02-11 MED ORDER — CLINDAMYCIN HCL 300 MG PO CAPS: 300.0000 mg | ORAL_CAPSULE | Freq: Four times a day (QID) | ORAL | Status: AC

## 2015-02-11 MED ORDER — SODIUM CHLORIDE 0.9 % IV BOLUS (SEPSIS)
1000.0000 mL | Freq: Once | INTRAVENOUS | Status: AC
  Administered 2015-02-11: 1000 mL via INTRAVENOUS

## 2015-02-11 MED ORDER — HYDROCODONE-ACETAMINOPHEN 5-325 MG PO TABS: 2.0000 | ORAL_TABLET | ORAL | Status: AC | PRN

## 2015-02-11 NOTE — ED Notes (Signed)
Pt amb to room 5 with quick steady gait in nad. Pt reports "a pimple" to his right elbow on Monday,swelling has increased, painful, red hot to touch. Pt reports subjective temps.

## 2015-02-11 NOTE — ED Provider Notes (Signed)
CSN: [id number]     Arrival date & time 02/11/15  0845 History   First MD Initiated Contact with Patient 02/11/15 [phone]     Chief Complaint  Patient presents with  . Cellulitis     (Consider location/radiation/quality/duration/timing/severity/associated sxs/prior Treatment) Patient is a 49 y.o. male presenting with extremity pain.  Extremity Pain This is a new problem. The current episode started yesterday. The problem occurs constantly. Pertinent negatives include no chest pain, no headaches and no shortness of breath. Nothing aggravates the symptoms. Nothing relieves the symptoms. He has tried nothing for the symptoms. The treatment provided mild relief.    Past Medical History  Diagnosis Date  . Hyperlipemia   . MSSA (methicillin susceptible Staphylococcus aureus) infection   . Vertebral osteomyelitis   . Discitis    Past Surgical History  Procedure Laterality Date  . No past surgeries     Family History  Problem Relation Age of Onset  . Diabetes    . Hyperlipidemia    . Hypertension    . Stroke     Social History  Substance Use Topics  . Smoking status: Former Research scientist (life sciences)  . Smokeless tobacco: Never Used     Comment: 08/27/2013 "quit smoking in the 1980's; didn't smoke much when I smoke"  . Alcohol Use: Yes     Comment: 08/27/2013 "maybe a glass of wine 6 times/yr, if that"    Review of Systems  Constitutional: Positive for fever, chills and activity change.  Eyes: Negative for pain.  Respiratory: Negative for shortness of breath.   Cardiovascular: Negative for chest pain.  Gastrointestinal: Negative for nausea.  Genitourinary: Negative for dysuria and urgency.  Skin: Positive for rash. Negative for wound.  Neurological: Negative for headaches.  All other systems reviewed and are negative.     Allergies  Review of patient's allergies indicates no known allergies.  Home Medications   Prior to Admission medications   Medication Sig Start Date End Date Taking?  Authorizing Provider  clindamycin (CLEOCIN) 300 MG capsule Take 1 capsule (300 mg total) by mouth 4 (four) times daily. X 7 days 02/11/15   Merrily Pew, MD  HYDROcodone-acetaminophen [facility]) 5-325 MG per tablet Take 2 tablets by mouth every 4 (four) hours as needed. 02/11/15   Merrily Pew, MD   BP 114/66 mmHg  Pulse 72  Temp(Src) 98.7 F (37.1 C) (Oral)  Resp 18  Ht 5\' 6"  (1.676 m)  Wt 160 lb (72.576 kg)  BMI 25.84 kg/m2  SpO2 99% Physical Exam  Constitutional: He is oriented to person, place, and time. He appears well-developed and well-nourished.  HENT:  Head: Normocephalic and atraumatic.  Eyes: Conjunctivae and EOM are normal.  Neck: Normal range of motion. Neck supple.  Cardiovascular: Normal rate and regular rhythm.   Pulmonary/Chest: Effort normal. No respiratory distress.  Abdominal: Soft. There is no tenderness.  Musculoskeletal: Normal range of motion. He exhibits tenderness (with erythema and induration around right elbow ). He exhibits no edema.  Neurological: He is alert and oriented to person, place, and time.  Skin: Skin is warm and dry.  Nursing note and vitals reviewed.   ED Course  Procedures (including critical care time) Labs Review Labs Reviewed - No data to display  Imaging Review No results found. I have personally reviewed and evaluated these images and lab results as part of my medical decision-making.   EKG Interpretation None      MDM   Final diagnoses:  Cellulitis of right upper extremity  Patient with cellulitis over his right elbow. No pain with range of motion. Doubt septic joint. Started on clindamycin here heart rate improved with fluids doubt sepsis. Will discharge on clindamycin with close PCP follow-up.  I have personally and contemperaneously reviewed labs and imaging and used in my decision making as above.   A medical screening exam was performed and I feel the patient has had an appropriate workup for their chief complaint at  this time and likelihood of emergent condition existing is low. They have been counseled on decision, discharge, follow up and which symptoms necessitate immediate return to the emergency department. They or their family verbally stated understanding and agreement with plan and discharged in stable condition.      Merrily Pew, MD 02/12/15 [phone]

## 2015-03-10 ENCOUNTER — Ambulatory Visit: Payer: Self-pay | Admitting: Surgery

## 2015-03-10 NOTE — H&P (Signed)
History of Present Illness  Patient words: new nodule right arm.  The patient is a 49 year old male who presents with skin lesions. The patient is referred by a primary care provider. The patient presented with a single lesion. There is pain and increasing size associated with the lesion(s). Past evaluation has included dermatology consultation. This problem has not been previously treated. He recently was treated for meningitis related to MSSA and also had cellulitis of the elbow. Because of this he associates this lesion with possibly a risk for futue infection.  He has no h/o HIV, hep C, he did smoke but quit > 20 years ago.  He was referred by Gaynelle Arabian   Past Surgical History Oral Surgery  Diagnostic Studies History Colonoscopy never  Allergies  No Known Drug Allergies09/28/2016  Medication History  No Current Medications Medications Reconciled  Social History Alcohol use Occasional alcohol use. Caffeine use Tea. No drug use Tobacco use Former smoker.  Family History  Diabetes Mellitus Father. Hypertension Father.  Review of Systems General Not Present- Appetite Loss, Chills, Fatigue, Fever, Night Sweats, Weight Gain and Weight Loss. Skin Not Present- Change in Wart/Mole, Dryness, Hives, Jaundice, New Lesions, Non-Healing Wounds, Rash and Ulcer. HEENT Present- Sore Throat. Not Present- Earache, Hearing Loss, Hoarseness, Nose Bleed, Oral Ulcers, Ringing in the Ears, Seasonal Allergies, Sinus Pain, Visual Disturbances, Wears glasses/contact lenses and Yellow Eyes. Respiratory Not Present- Bloody sputum, Chronic Cough, Difficulty Breathing, Snoring and Wheezing. Breast Not Present- Breast Mass, Breast Pain, Nipple Discharge and Skin Changes. Cardiovascular Not Present- Chest Pain, Difficulty Breathing Lying Down, Leg Cramps, Palpitations, Rapid Heart Rate, Shortness of Breath and Swelling of Extremities. Gastrointestinal Not Present- Abdominal Pain,  Bloating, Bloody Stool, Change in Bowel Habits, Chronic diarrhea, Constipation, Difficulty Swallowing, Excessive gas, Gets full quickly at meals, Hemorrhoids, Indigestion, Nausea, Rectal Pain and Vomiting. Male Genitourinary Not Present- Blood in Urine, Change in Urinary Stream, Frequency, Impotence, Nocturia, Painful Urination, Urgency and Urine Leakage. Musculoskeletal Not Present- Back Pain, Joint Pain, Joint Stiffness, Muscle Pain, Muscle Weakness and Swelling of Extremities. Neurological Not Present- Decreased Memory, Fainting, Headaches, Numbness, Seizures, Tingling, Tremor, Trouble walking and Weakness. Psychiatric Not Present- Anxiety, Bipolar, Change in Sleep Pattern, Depression, Fearful and Frequent crying. Endocrine Not Present- Cold Intolerance, Excessive Hunger, Hair Changes, Heat Intolerance, Hot flashes and New Diabetes. Hematology Not Present- Easy Bruising, Excessive bleeding, Gland problems, HIV and Persistent Infections.   Vitals  Weight: 170.2 lb Height: 66in Body Surface Area: 1.9 m Body Mass Index: 27.47 kg/m Temp.: 98.98F(Oral)  Pulse: 78 (Regular)  BP: 122/78 (Sitting, Left Arm, Standard)    Physical Exam  General Mental Status-Alert. General Appearance-Cooperative. Orientation-Oriented X4. Posture-Normal posture.  Integumentary Global Assessment Upon inspection and palpation of skin surfaces of the - Head/Face: no rashes, ulcers, lesions or evidence of photo damage. No palpable nodules or masses and Neck: no visible lesions or palpable masses. Note: right arm: on the posterior surface overlying the ulnar olecranon is a 4cm cyst, mobile to underlying structures. upper arm contains 1cm scar from seborrheic lesions removed. Left arm: scar from vaccination injection trunk: left back with one 85mm dark nevus without irregularity and 4 other small nevi without irregularity   Head and Neck Head-normocephalic, atraumatic with no lesions or  palpable masses. Face Global Assessment - atraumatic. Thyroid Gland Characteristics - normal size and consistency.  Eye Eyeball - Bilateral-Extraocular movements intact. Sclera/Conjunctiva - Bilateral-No scleral icterus, No Discharge.  ENMT Nose and Sinuses External Inspection of the Nose - no  deformities observed, no swelling present.  Chest and Lung Exam Palpation Palpation of the chest reveals - Non-tender. Auscultation Breath sounds - Normal.  Cardiovascular Auscultation Rhythm - Regular. Heart Sounds - S1 WNL and S2 WNL. Carotid arteries - No Carotid bruit.  Abdomen Inspection Inspection of the abdomen reveals - No Visible peristalsis, No Abnormal pulsations and No Paradoxical movements. Palpation/Percussion Palpation and Percussion of the abdomen reveal - Soft, Non Tender, No Rebound tenderness, No Rigidity (guarding), No hepatosplenomegaly and No Palpable abdominal masses.  Peripheral Vascular Upper Extremity Palpation - Pulses bilaterally normal. Lower Extremity Palpation - Edema - Bilateral - No edema.  Neurologic Neurologic evaluation reveals -normal sensation and normal coordination.  Neuropsychiatric Mental status exam performed with findings of-able to articulate well with normal speech/language, rate, volume and coherence and thought content normal with ability to perform basic computations and apply abstract reasoning.  Musculoskeletal Normal Exam - Bilateral-Upper Extremity Strength Normal and Lower Extremity Strength Normal.    Assessment & Plan Geoffery Spruce MD; 02/23/2015 2:37 PM) CYST OF SKIN (L72.9) Impression: 49 yo male with recent history of skin infection over right elbow. He notes a small cyst in this area that occasionally swells and would like it removed. We discussed that this can be done under MAC and as an outpatient. We discussed the risks of infection and damage to underlying structures, in this case the tendons,  ligaments, and capsule around the elbow. He showed understanding and agreed to proceed. -schedule for surgery   The surgical procedure has been discussed with the patient.  Potential risks, benefits, alternative treatments, and expected outcomes have been explained.  All of the patient's questions at this time have been answered.  The likelihood of reaching the patient's treatment goal is good.  The patient understand the proposed surgical procedure and wishes to proceed.   Imogene Burn. Georgette Dover, MD, [facility]  General/ Trauma Surgery  03/10/2015 3:35 PM

## 2015-09-23 IMAGING — XA IR DISC ASPIRATION W/IMAG GUIDE
1 series · 8 of 8 positions shown · non-contrast
Comparison: none

CLINICAL DATA: Severe low back pain. Abnormal MRI of the lumbar
sacral spine. L4-5 diskitis with osteomyelitis.

[Series 300: ir radiologist eval & mgmt · 8 of 8 slices shown]
[im 1/8]
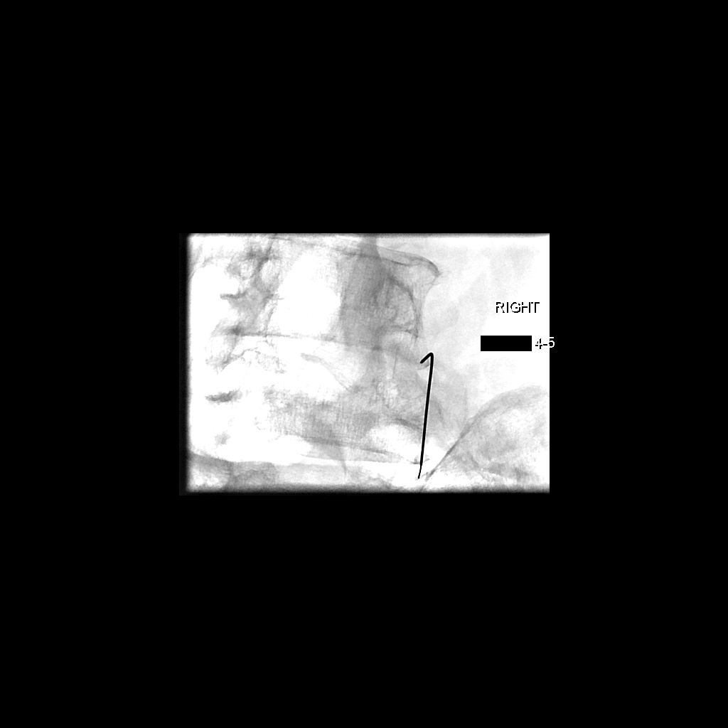
[im 2/8]
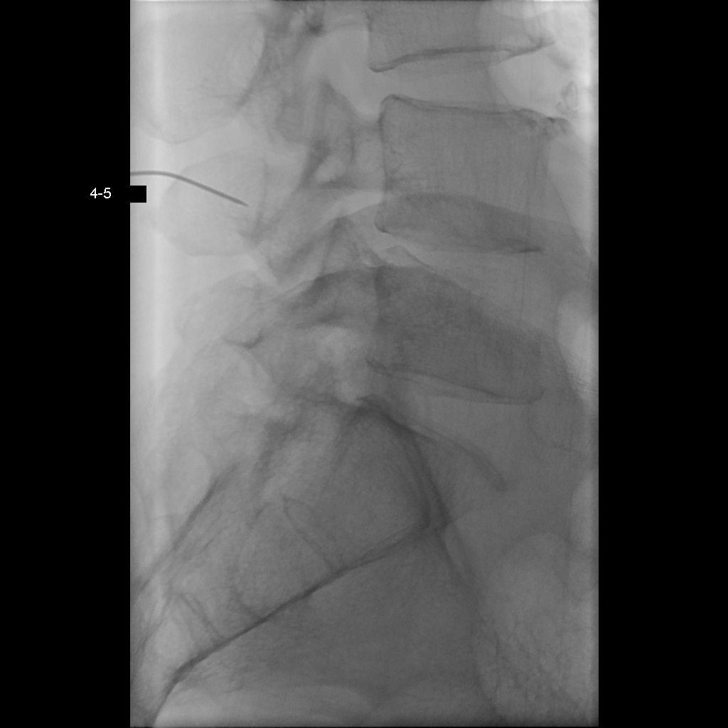
[im 3/8]
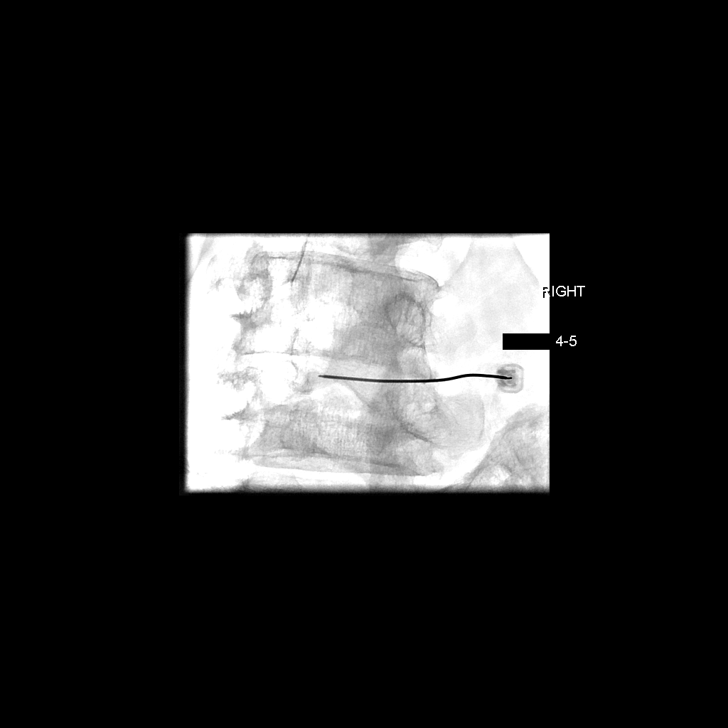
[im 4/8]
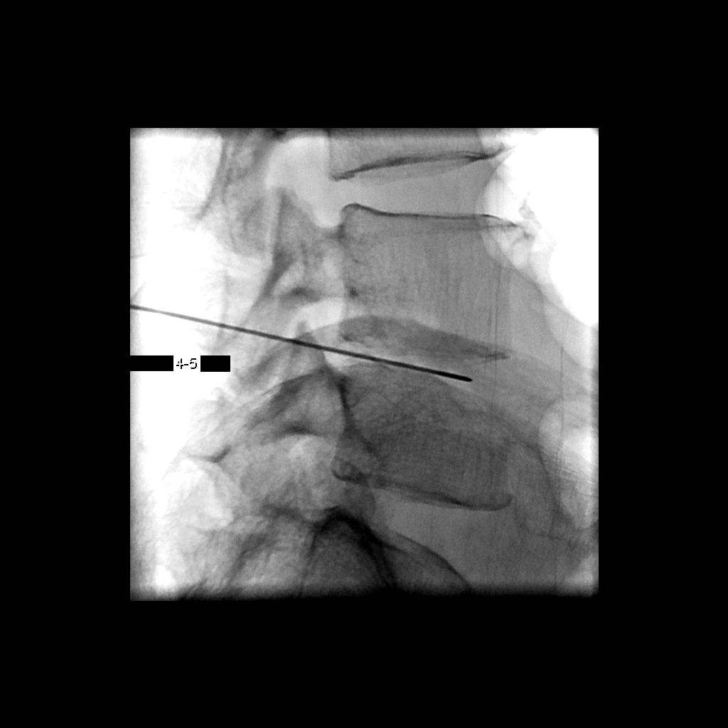
[im 5/8]
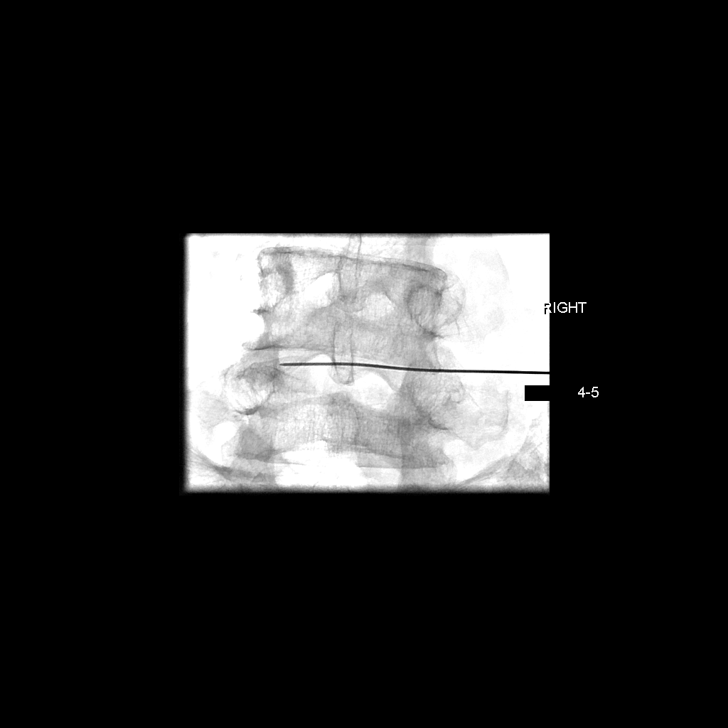
[im 6/8]
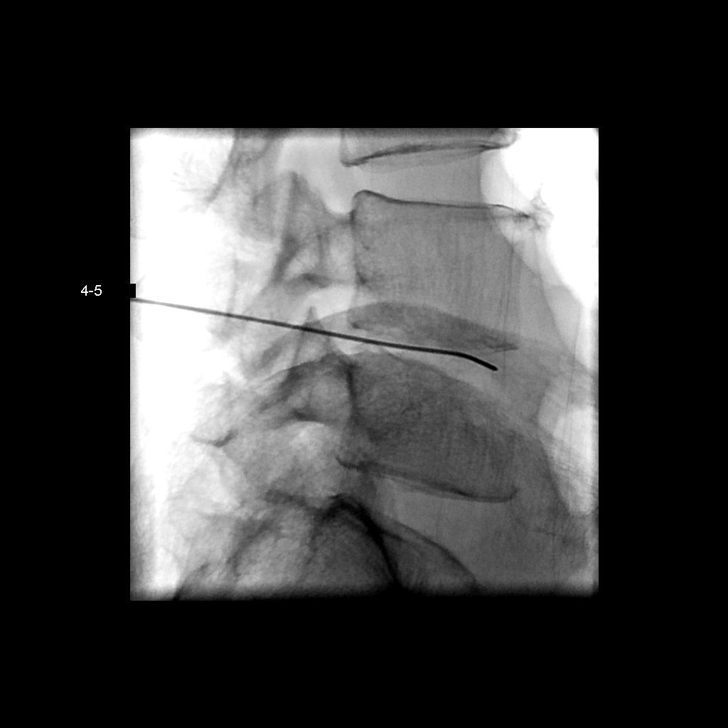
[im 7/8]
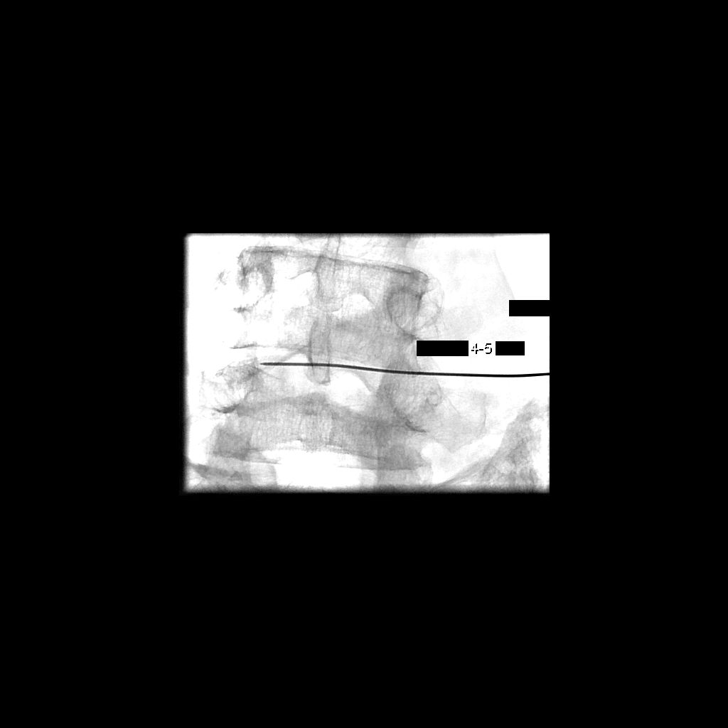
[im 8/8]
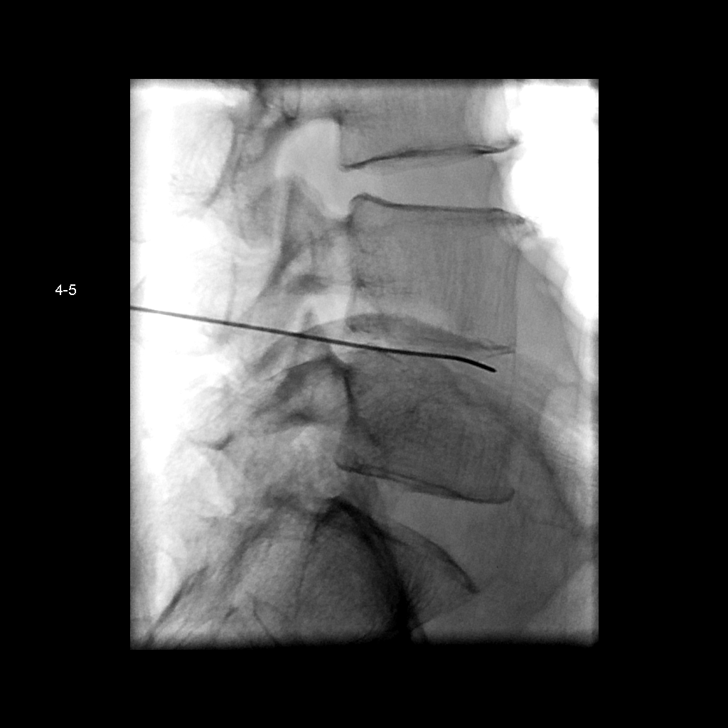

[8 of 8 positions shown; findings below may reference images not displayed]

EXAM:
IR DISC ASPIRATION WITH IMAGE GUIDE

MEDICATIONS:
Versed 1 mg IV.  Fentanyl 25 mcg IV.

CONTRAST:  None.

FLUOROSCOPY TIME:  9 min.

PROCEDURE:
Following a full explanation of the procedure along with the
potential associated complications, an informed witnessed consent
was obtained.

The patient was placed prone on the fluoroscopic table.

The skin overlying the lumbar region was then prepped and draped in
the usual sterile fashion. The skin entry site for the needle at
L4-5 to the right of midline was then infiltrated with .25%
bupivacaine.

Using biplane intermittent fluoroscopy, a 20 gauge Zukan biopsy
needle was then advanced into the disk space at L4-5 without
difficulty. Using a 20 mL syringe, bloody aspirate was obtained
thick in consistency. A second pass was made with a different needle
also with retrieval of thick bloody aspirate with a 20 mL syringe.
The two aspirates were then sent for microbiologic analysis. The
needle was removed. Hemostasis was achieved at the skin entry site.
The patient tolerated the procedure well.

Complications: None.
FINDINGS: As above.
IMPRESSION: Status post fluoroscopic guided disk aspiration at L4-5 for discitis
and osteomyelitis.

## 2021-02-13 ENCOUNTER — Other Ambulatory Visit: Payer: Self-pay | Admitting: Otolaryngology

## 2021-02-13 DIAGNOSIS — J329 Chronic sinusitis, unspecified: Secondary | ICD-10-CM

## 2021-03-02 ENCOUNTER — Other Ambulatory Visit: Payer: Self-pay

## 2021-03-02 ENCOUNTER — Ambulatory Visit
Admission: RE | Admit: 2021-03-02 | Discharge: 2021-03-02 | Disposition: A | Discharge status: Home / Self Care | Payer: BC Managed Care – PPO | Source: Ambulatory Visit | Attending: Otolaryngology | Admitting: Otolaryngology

## 2021-03-02 DIAGNOSIS — J329 Chronic sinusitis, unspecified: Secondary | ICD-10-CM

## 2023-03-29 IMAGING — CT CT MAXILLOFACIAL W/O CM
1 series · 15 of 30 positions shown, 19 images · non-contrast
Comparison: No pertinent prior exams available for comparison.

CLINICAL DATA: Chronic sinusitis, unspecified location. Additional
history provided by scanning technologist: Patient reports chronic
sinusitis, nasal spray treatment without relief, congestion.

EXAM:
CT MAXILLOFACIAL WITHOUT CONTRAST
TECHNIQUE: Multidetector CT images of the paranasal sinuses were obtained using
the standard protocol without intravenous contrast.

[Series 4: soft tissue · axial · 0.43mm/px · z∈[-177,-61]mm · 15 of 126 slices shown, 19 images]
[im 5/126  brain]
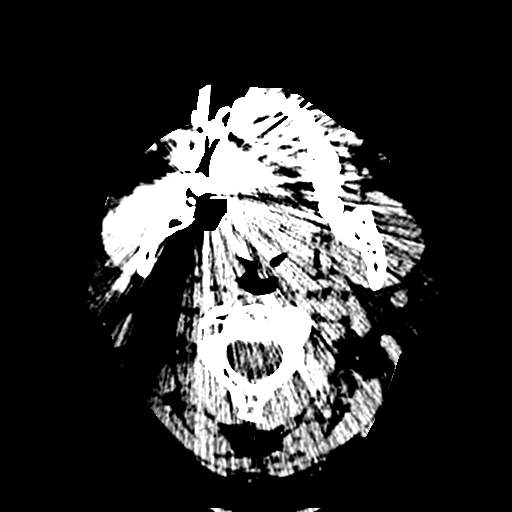
[im 5/126  bone]
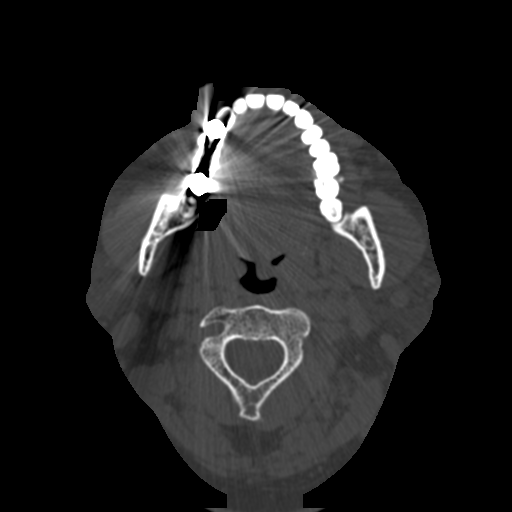
[im 13/126  bone]
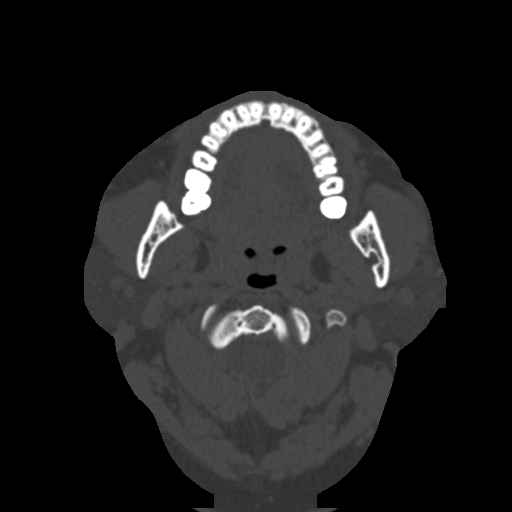
[im 22/126  bone]
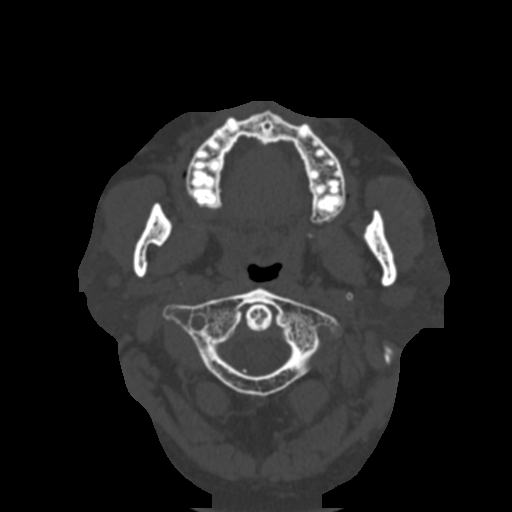
[im 31/126  bone]
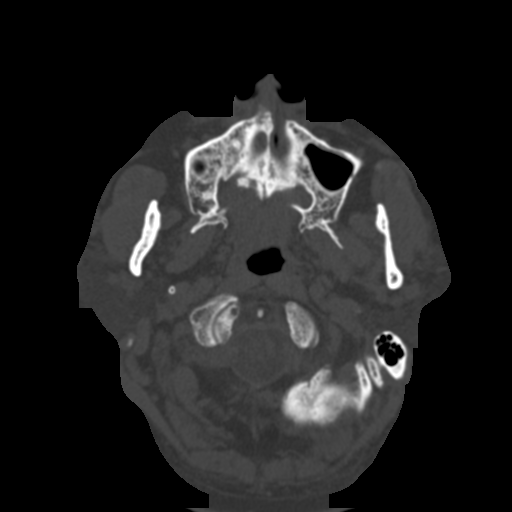
[im 39/126  brain]
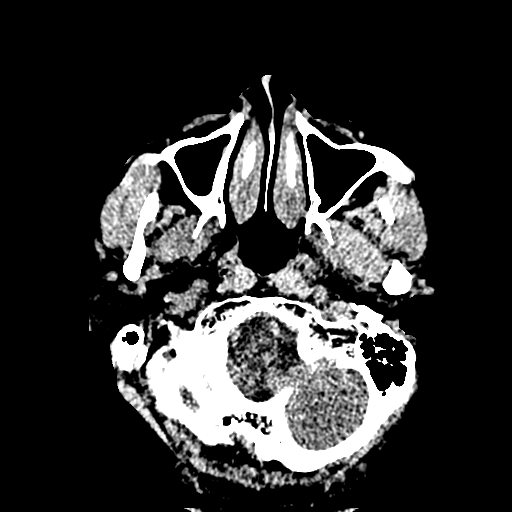
[im 39/126  bone]
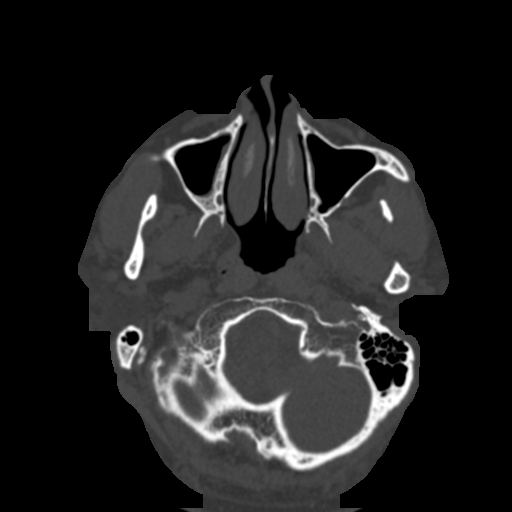
[im 48/126  bone]
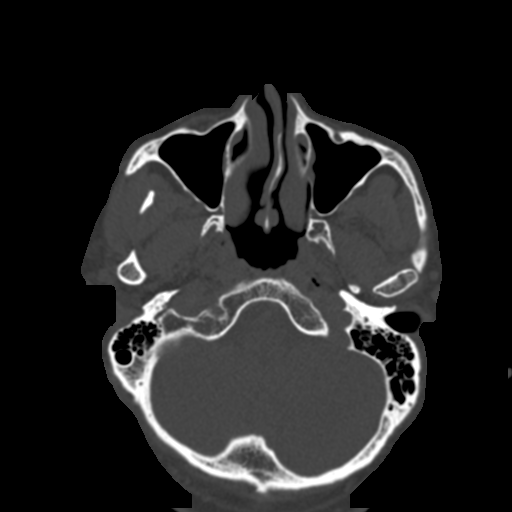
[im 57/126  bone]
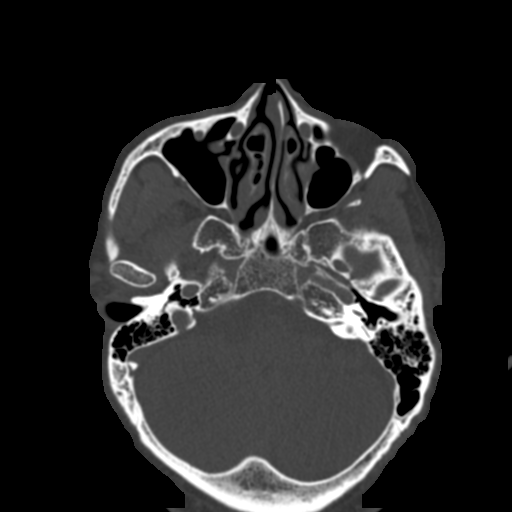
[im 65/126  bone]
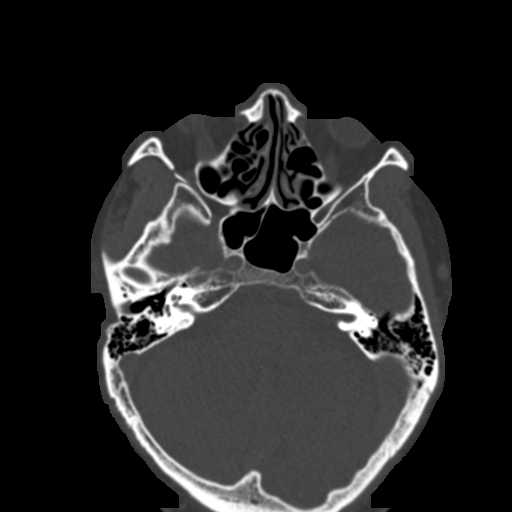
[im 69/126  brain]
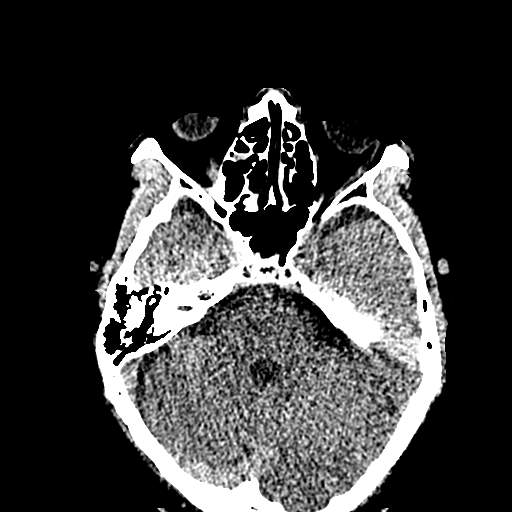
[im 69/126  bone]
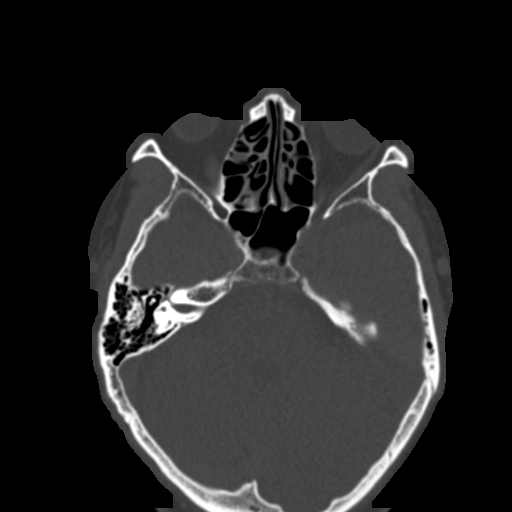
[im 78/126  bone]
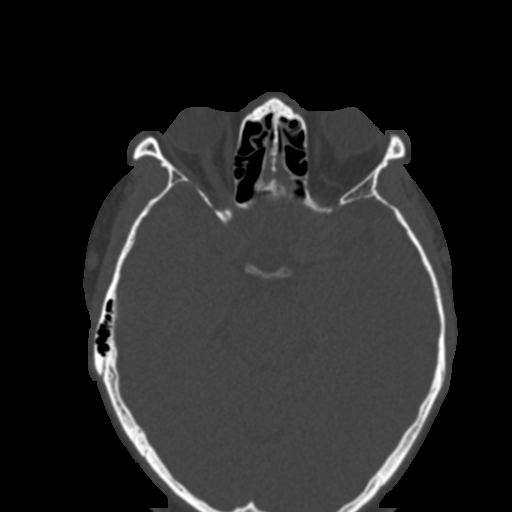
[im 87/126  bone]
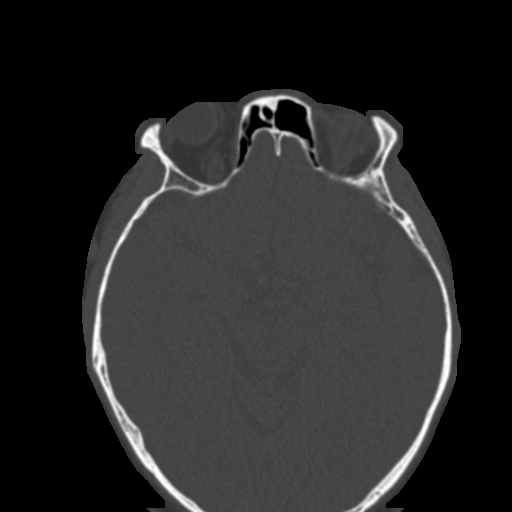
[im 95/126  bone]
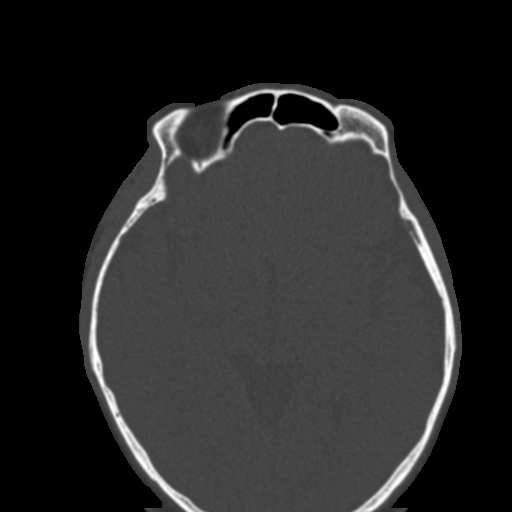
[im 104/126  brain]
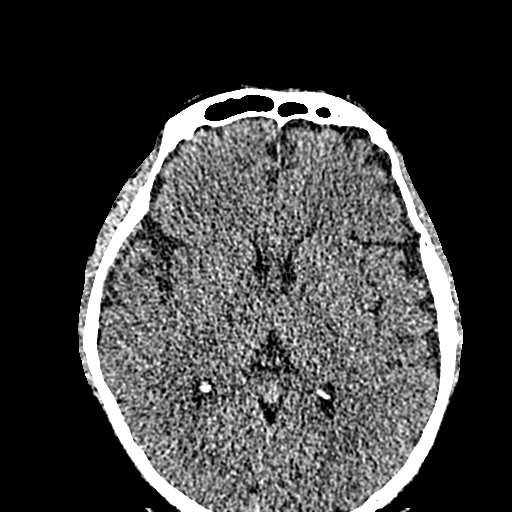
[im 104/126  bone]
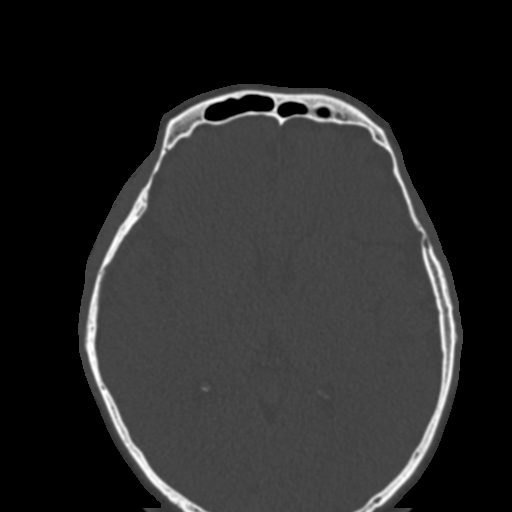
[im 113/126  bone]
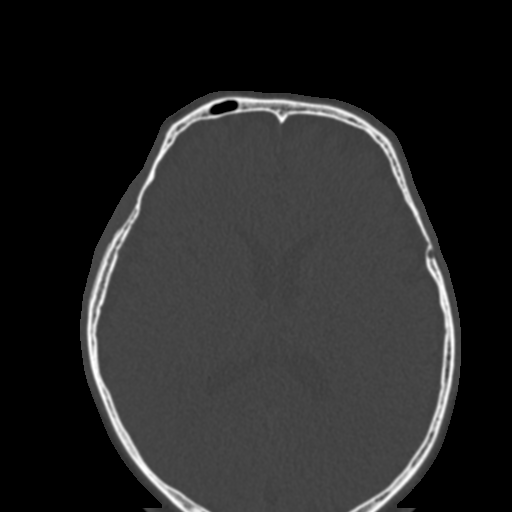
[im 121/126  bone]
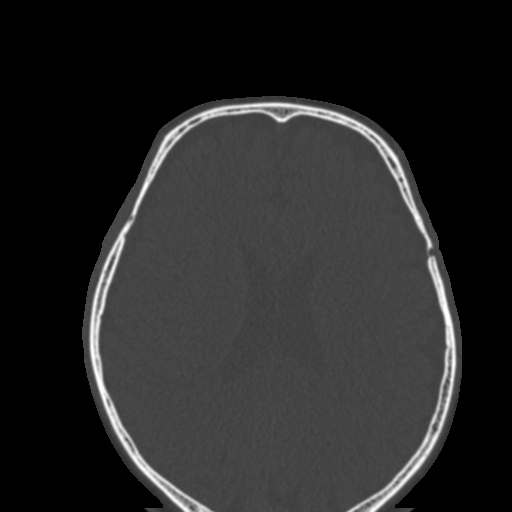

[15 of 30 positions shown; findings below may reference images not displayed]

FINDINGS: Paranasal sinuses:

Frontal: Minimal mucosal thickening within the frontoethmoidal
recesses bilaterally. Patent frontal sinus drainage pathways.

Ethmoid: Minimal scattered mucosal thickening and fluid within the
bilateral ethmoid sinuses.

Maxillary: Minimally mucosal thickening within the bilateral
maxillary sinuses.

Sphenoid: Normally aerated. Patent sphenoethmoidal recesses.

Right ostiomeatal unit: The maxillary sinus ostium and ethmoid
infundibulum are mildly narrowed by the presence of a Haller cell
and mild mucosal thickening. Otherwise patent.

Left ostiomeatal unit: The maxillary sinus ostium and ethmoid
infundibulum are mildly narrowed by the presence of a Haller cell
and mucosal thickening. Otherwise patent.

Nasal passages: Leftward deviation of the bony nasal septum with
slight leftward bony spurring. Bilateral middle turbinate concha
bullosa. Mild mucosal thickening within the bilateral nasal
passages.

Anatomy: Pneumatization is present superior to the anterior ethmoid
notches bilaterally. Symmetric and intact olfactory grooves and
fovea ethmoidalis, Keros II (4-7mm). Sellar sphenoid pneumatization
pattern.
IMPRESSION: Mild disease within the bilateral frontoethmoidal recesses, ethmoid
sinuses and maxillary sinuses as described.

The maxillary sinus ostia/ethmoid infundibula are mildly narrowed by
the presence of Haller cells and mucosal thickening, bilaterally.

Leftward deviation of the bony nasal septum with slight leftward
bony spurring.

Bilateral middle turbinate concha bullosa.

Mild mucosal thickening within the bilateral nasal passages.
